# Patient Record
Sex: Male | Born: 1985 | Hispanic: Yes | Marital: Married | Smoking: Current some day smoker
Health system: Southern US, Community
[De-identification: ages and names within clinical notes are randomized; demographics above are authoritative.]

## PROBLEM LIST (undated history)

## (undated) DIAGNOSIS — F419 Anxiety disorder, unspecified: Secondary | ICD-10-CM

---
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
## 2003-03-16 ENCOUNTER — Encounter: Payer: Self-pay | Admitting: Emergency Medicine

## 2003-03-16 ENCOUNTER — Emergency Department (HOSPITAL_COMMUNITY): Admission: EM | Admit: 2003-03-16 | Discharge: 2003-03-16 | Payer: Self-pay | Admitting: Emergency Medicine

## 2009-05-17 ENCOUNTER — Emergency Department (HOSPITAL_COMMUNITY): Admission: EM | Admit: 2009-05-17 | Discharge: 2009-05-17 | Payer: Self-pay | Admitting: Emergency Medicine

## 2009-06-22 ENCOUNTER — Emergency Department (HOSPITAL_COMMUNITY): Admission: EM | Admit: 2009-06-22 | Discharge: 2009-06-22 | Payer: Self-pay | Admitting: Emergency Medicine

## 2009-10-08 ENCOUNTER — Emergency Department (HOSPITAL_BASED_OUTPATIENT_CLINIC_OR_DEPARTMENT_OTHER): Admission: EM | Admit: 2009-10-08 | Discharge: 2009-10-08 | Payer: Self-pay | Admitting: Emergency Medicine

## 2009-10-08 ENCOUNTER — Ambulatory Visit: Payer: Self-pay | Admitting: Diagnostic Radiology

## 2010-04-06 IMAGING — CT CT HEAD W/O CM
1 series · 16 of 30 positions shown, 20 images · non-contrast
Comparison: None

CLINICAL DATA: Headache and dizziness.

CT HEAD WITHOUT CONTRAST
TECHNIQUE: Contiguous axial images were obtained from the base of
the skull through the vertex without contrast.

[Series 2: brain · axial · 0.47mm/px · z∈[+134,+275]mm · 16 of 30 slices shown, 20 images]
[im 2/30  brain]
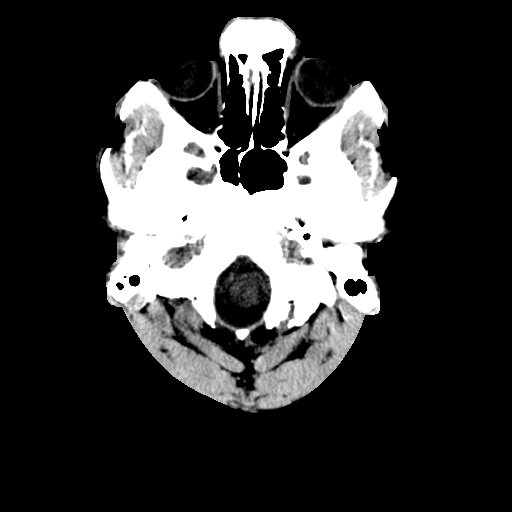
[im 2/30  bone]
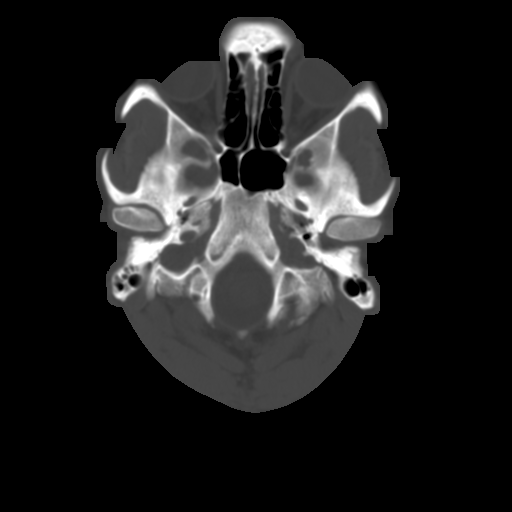
[im 4/30  brain]
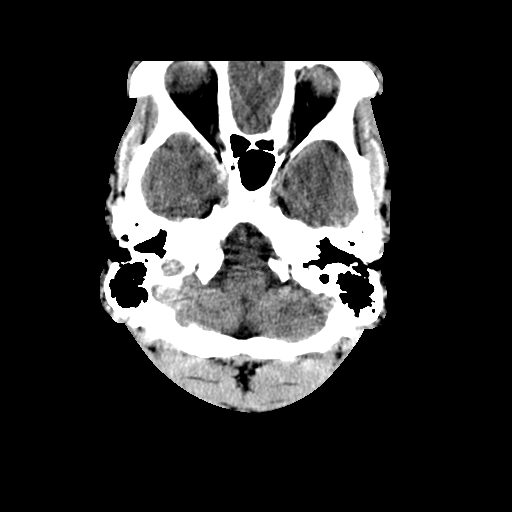
[im 6/30  brain]
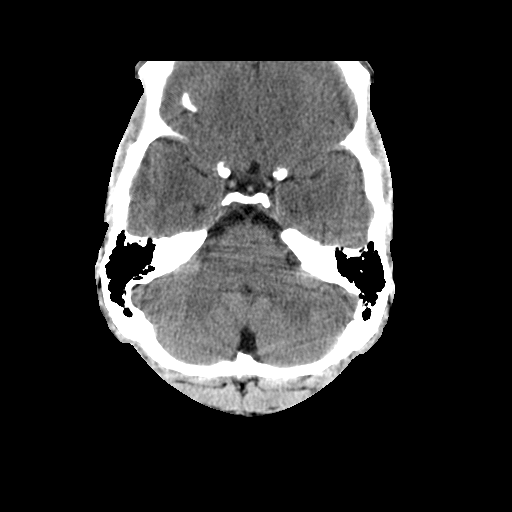
[im 8/30  brain]
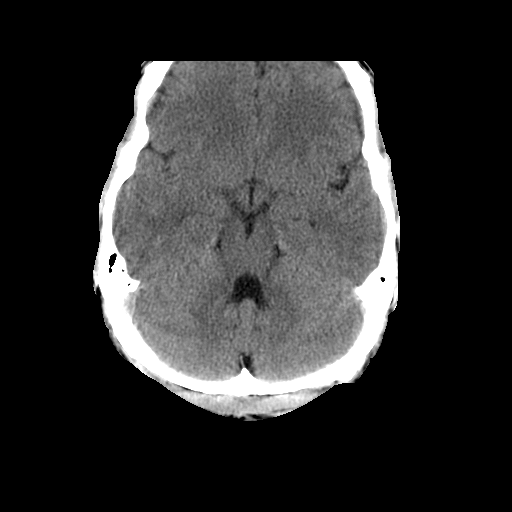
[im 9/30  brain]
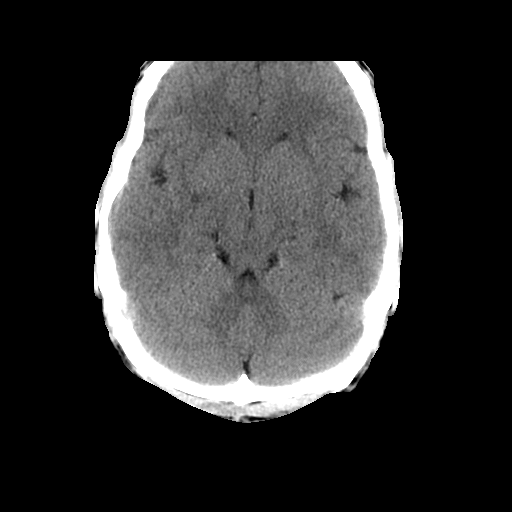
[im 9/30  bone]
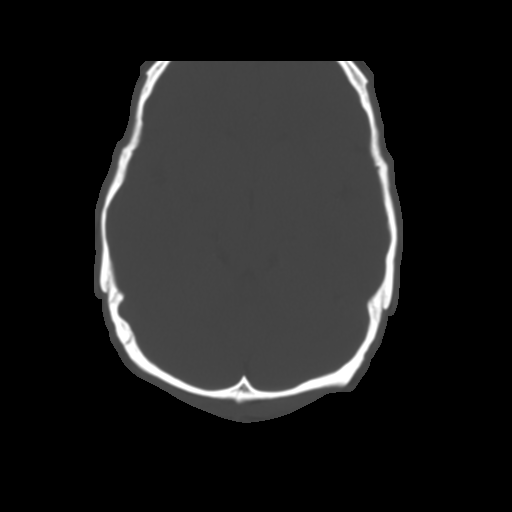
[im 11/30  brain]
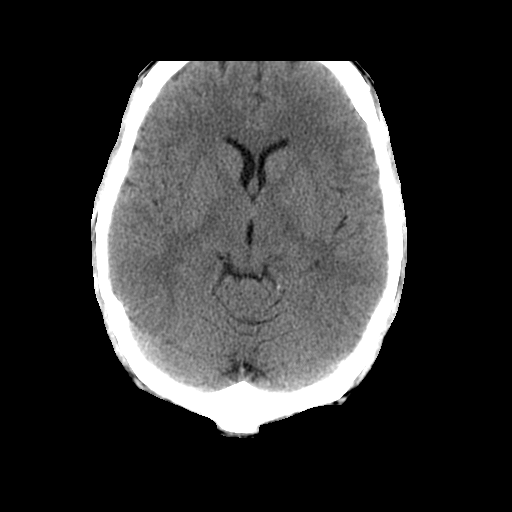
[im 13/30  brain]
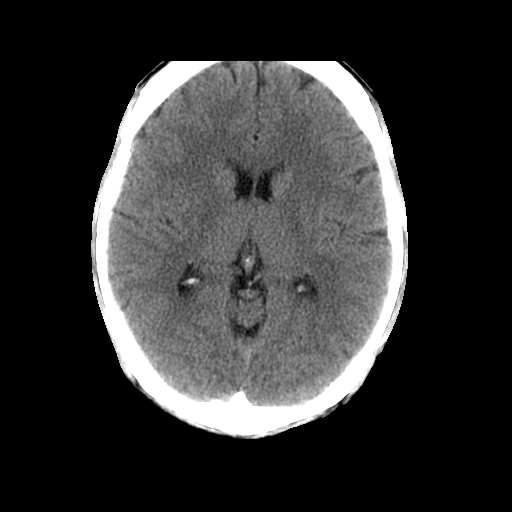
[im 15/30  brain]
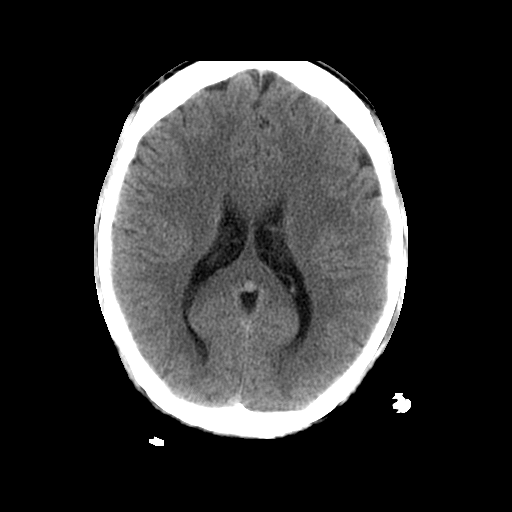
[im 16/30  brain]
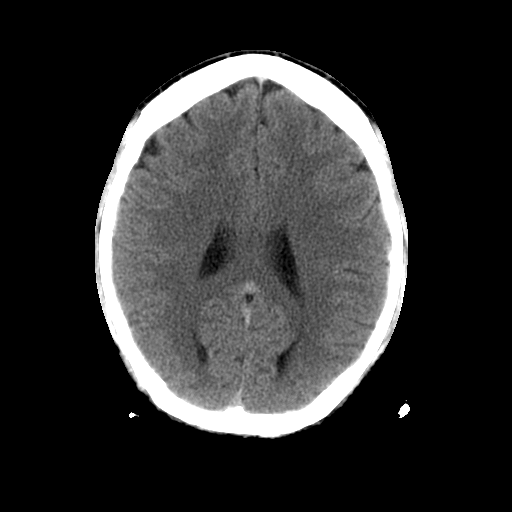
[im 16/30  bone]
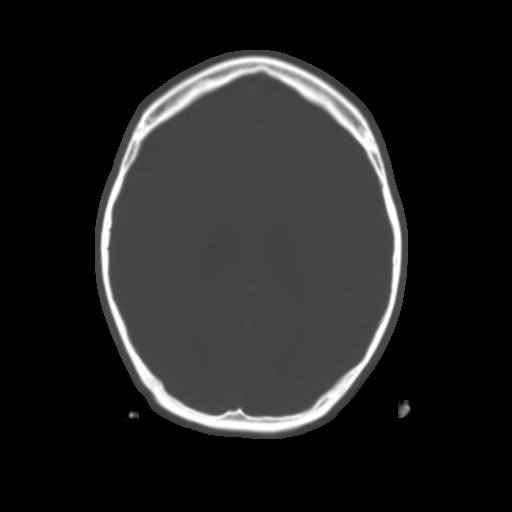
[im 18/30  brain]
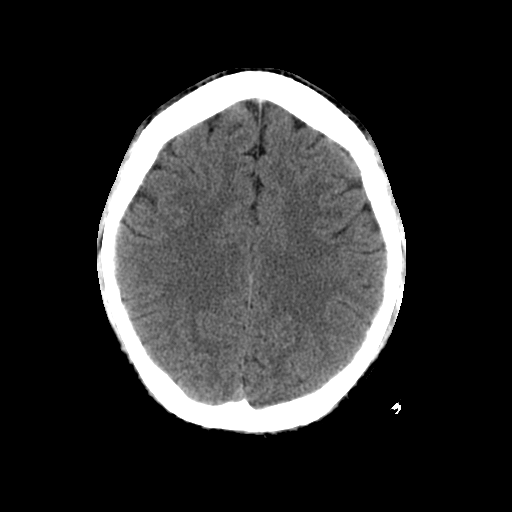
[im 20/30  brain]
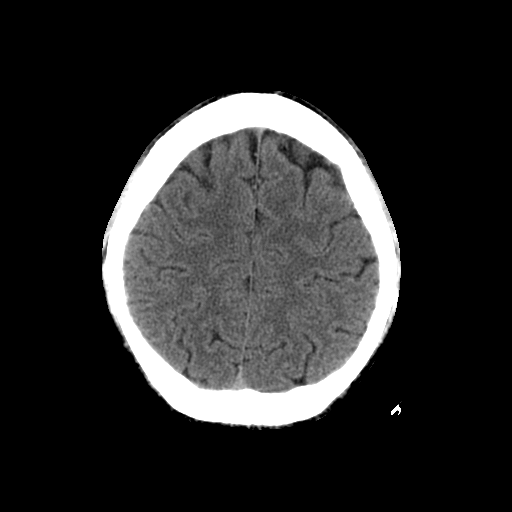
[im 22/30  brain]
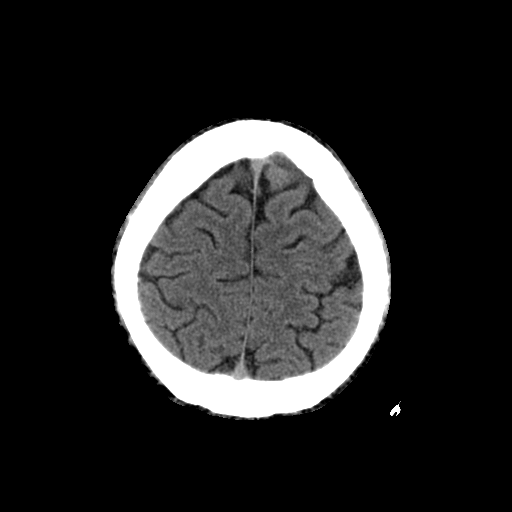
[im 23/30  brain]
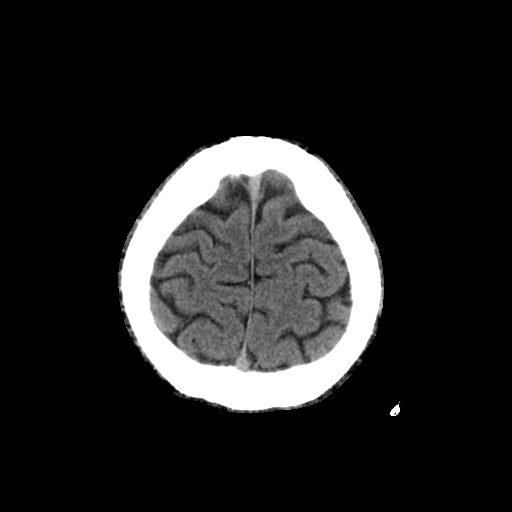
[im 23/30  bone]
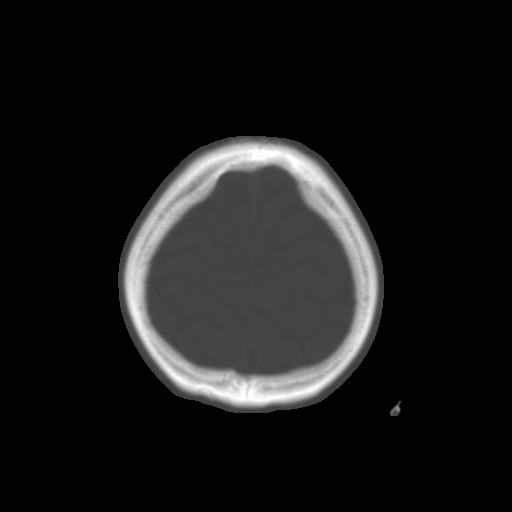
[im 25/30  brain]
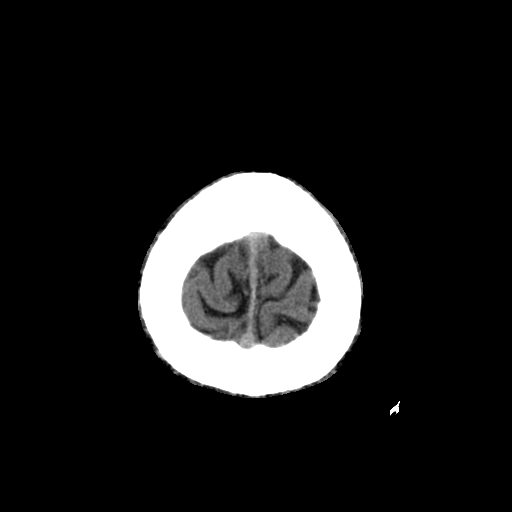
[im 27/30  brain]
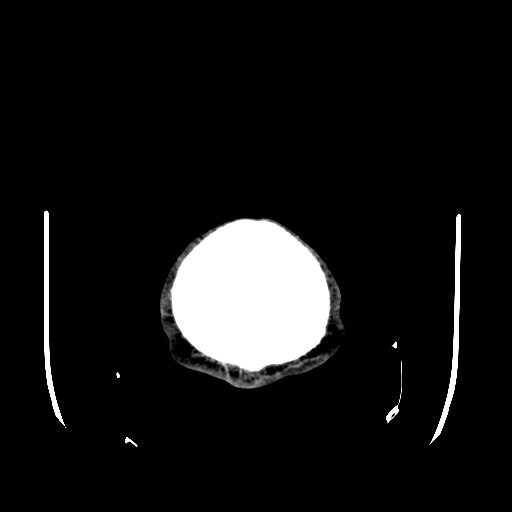
[im 29/30  brain]
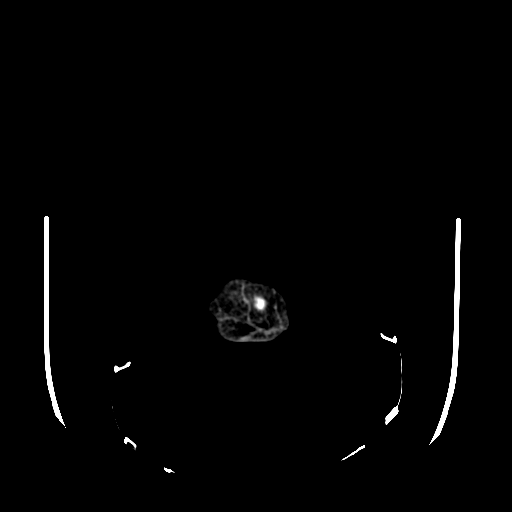

[16 of 30 positions shown; findings below may reference images not displayed]

FINDINGS: No intracranial abnormalities are identified. There is no
evidence of mass lesion or mass effect, hydrocephalus, extra-axial
fluid collection, midline shift, hemorrhage, or acute infarction.
The visualized bony calvarium is unremarkable.
IMPRESSION: Unremarkable noncontrast head CT.

## 2010-08-20 LAB — URINALYSIS, ROUTINE W REFLEX MICROSCOPIC
Bilirubin Urine: NEGATIVE
Glucose, UA: NEGATIVE mg/dL
Hgb urine dipstick: NEGATIVE
Ketones, ur: NEGATIVE mg/dL
Nitrite: NEGATIVE
Protein, ur: NEGATIVE mg/dL
Specific Gravity, Urine: 1.026 (ref 1.005–1.030)
Urobilinogen, UA: 0.2 mg/dL (ref 0.0–1.0)
pH: 6 (ref 5.0–8.0)

## 2010-08-20 LAB — CBC
HCT: 45.3 % (ref 39.0–52.0)
MCHC: 33.9 g/dL (ref 30.0–36.0)
MCV: 85.9 fL (ref 78.0–100.0)
Platelets: 277 10*3/uL (ref 150–400)
WBC: 6.6 10*3/uL (ref 4.0–10.5)

## 2010-08-20 LAB — BASIC METABOLIC PANEL
BUN: 14 mg/dL (ref 6–23)
CO2: 29 mEq/L (ref 19–32)
Chloride: 101 mEq/L (ref 96–112)
Creatinine, Ser: 0.96 mg/dL (ref 0.4–1.5)
Glucose, Bld: 97 mg/dL (ref 70–99)
Potassium: 4 mEq/L (ref 3.5–5.1)

## 2010-08-20 LAB — RAPID URINE DRUG SCREEN, HOSP PERFORMED
Amphetamines: NOT DETECTED
Barbiturates: NOT DETECTED
Benzodiazepines: NOT DETECTED
Cocaine: NOT DETECTED
Opiates: NOT DETECTED
Tetrahydrocannabinol: NOT DETECTED

## 2010-08-20 LAB — DIFFERENTIAL
Basophils Relative: 0 % (ref 0–1)
Eosinophils Absolute: 0.4 10*3/uL (ref 0.0–0.7)
Eosinophils Relative: 6 % — ABNORMAL HIGH (ref 0–5)
Lymphs Abs: 1.7 10*3/uL (ref 0.7–4.0)
Neutrophils Relative %: 59 % (ref 43–77)

## 2010-08-22 LAB — BASIC METABOLIC PANEL
CO2: 24 mEq/L (ref 19–32)
Calcium: 9.4 mg/dL (ref 8.4–10.5)
Creatinine, Ser: 1.1 mg/dL (ref 0.4–1.5)
GFR calc Af Amer: >60 mL/min (ref >60)
GFR calc non Af Amer: >60 mL/min (ref >60)
Glucose, Bld: 105 mg/dL — ABNORMAL HIGH (ref 70–99)

## 2010-09-05 LAB — DIFFERENTIAL
Basophils Absolute: 0 10*3/uL (ref 0.0–0.1)
Basophils Relative: 0 % (ref 0–1)
Lymphocytes Relative: 7 % — ABNORMAL LOW (ref 12–46)
Neutro Abs: 10.5 10*3/uL — ABNORMAL HIGH (ref 1.7–7.7)
Neutrophils Relative %: 86 % — ABNORMAL HIGH (ref 43–77)

## 2010-09-05 LAB — URINALYSIS, ROUTINE W REFLEX MICROSCOPIC
Nitrite: NEGATIVE
Specific Gravity, Urine: 1.012 (ref 1.005–1.030)
Urobilinogen, UA: 0.2 mg/dL (ref 0.0–1.0)
pH: 6.5 (ref 5.0–8.0)

## 2010-09-05 LAB — RAPID URINE DRUG SCREEN, HOSP PERFORMED
Barbiturates: POSITIVE — AB
Opiates: NOT DETECTED
Tetrahydrocannabinol: POSITIVE — AB

## 2010-09-05 LAB — POCT CARDIAC MARKERS
CKMB, poc: <1 ng/mL — ABNORMAL LOW (ref 1.0–8.0)
Troponin i, poc: <0.05 ng/mL (ref 0.00–0.09)

## 2010-09-05 LAB — CBC
MCHC: 33.8 g/dL (ref 30.0–36.0)
RBC: 5.59 MIL/uL (ref 4.22–5.81)
RDW: 14.2 % (ref 11.5–15.5)

## 2012-06-01 ENCOUNTER — Emergency Department (HOSPITAL_BASED_OUTPATIENT_CLINIC_OR_DEPARTMENT_OTHER)
Admission: EM | Admit: 2012-06-01 | Discharge: 2012-06-01 | Disposition: A | Discharge status: Home / Self Care | Payer: Self-pay | Attending: Emergency Medicine | Admitting: Emergency Medicine

## 2012-06-01 ENCOUNTER — Encounter (HOSPITAL_BASED_OUTPATIENT_CLINIC_OR_DEPARTMENT_OTHER): Payer: Self-pay | Admitting: *Deleted

## 2012-06-01 ENCOUNTER — Emergency Department (HOSPITAL_BASED_OUTPATIENT_CLINIC_OR_DEPARTMENT_OTHER): Payer: Self-pay

## 2012-06-01 DIAGNOSIS — B9789 Other viral agents as the cause of diseases classified elsewhere: Secondary | ICD-10-CM | POA: Insufficient documentation

## 2012-06-01 DIAGNOSIS — R059 Cough, unspecified: Secondary | ICD-10-CM | POA: Insufficient documentation

## 2012-06-01 DIAGNOSIS — B349 Viral infection, unspecified: Secondary | ICD-10-CM

## 2012-06-01 DIAGNOSIS — M545 Low back pain, unspecified: Secondary | ICD-10-CM | POA: Insufficient documentation

## 2012-06-01 DIAGNOSIS — R05 Cough: Secondary | ICD-10-CM | POA: Insufficient documentation

## 2012-06-01 DIAGNOSIS — J029 Acute pharyngitis, unspecified: Secondary | ICD-10-CM | POA: Insufficient documentation

## 2012-06-01 DIAGNOSIS — F17200 Nicotine dependence, unspecified, uncomplicated: Secondary | ICD-10-CM | POA: Insufficient documentation

## 2012-06-01 HISTORY — DX: Anxiety disorder, unspecified: F41.9

## 2012-06-01 LAB — URINALYSIS, ROUTINE W REFLEX MICROSCOPIC
Bilirubin Urine: NEGATIVE
Leukocytes, UA: NEGATIVE
Nitrite: NEGATIVE
Specific Gravity, Urine: 1.031 — ABNORMAL HIGH (ref 1.005–1.030)
Urobilinogen, UA: 1 mg/dL (ref 0.0–1.0)

## 2012-06-01 LAB — CBC WITH DIFFERENTIAL/PLATELET
Basophils Relative: 0 % (ref 0–1)
Eosinophils Absolute: 0.3 10*3/uL (ref 0.0–0.7)
Lymphs Abs: 1.8 10*3/uL (ref 0.7–4.0)
MCH: 27.9 pg (ref 26.0–34.0)
Neutrophils Relative %: 57 % (ref 43–77)
Platelets: 215 10*3/uL (ref 150–400)
RBC: 5.6 MIL/uL (ref 4.22–5.81)

## 2012-06-01 LAB — BASIC METABOLIC PANEL
Calcium: 9.5 mg/dL (ref 8.4–10.5)
Chloride: 103 mEq/L (ref 96–112)
Creatinine, Ser: 1.2 mg/dL (ref 0.50–1.35)
GFR calc Af Amer: >90 mL/min (ref >90)

## 2012-06-01 LAB — RAPID STREP SCREEN (MED CTR MEBANE ONLY): Streptococcus, Group A Screen (Direct): NEGATIVE

## 2012-06-01 LAB — MONONUCLEOSIS SCREEN: Mono Screen: NEGATIVE

## 2012-06-01 NOTE — ED Provider Notes (Signed)
History     CSN: [id number]  Arrival date & time 06/01/12  1301   First MD Initiated Contact with Patient 06/01/12 1341      Chief Complaint  Patient presents with  . Fatigue    (Consider location/radiation/quality/duration/timing/severity/associated sxs/prior treatment) HPI Comments: Patient presents with a one week history of fatigue, weakness, cough, sore throat, low back ache.  He has felt fevered at home but has not taken his temperature.  He denies sick contacts.  No aggravating or alleviating factors.  Cough is non-productive.  The history is provided by the patient.    Past Medical History  Diagnosis Date  . Anxiety     History reviewed. No pertinent past surgical history.  History reviewed. No pertinent family history.  History  Substance Use Topics  . Smoking status: Current Every Day Smoker  . Smokeless tobacco: Not on file  . Alcohol Use: Yes      Review of Systems  All other systems reviewed and are negative.    Allergies  Review of patient's allergies indicates no known allergies.  Home Medications  No current outpatient prescriptions on file.  BP 116/69  Pulse 81  Temp 98.9 F (37.2 C) (Oral)  Resp 16  Ht 5\' 10"  (1.778 m)  Wt 220 lb (99.791 kg)  BMI 31.57 kg/m2  SpO2 99%  Physical Exam  Nursing note and vitals reviewed. Constitutional: He is oriented to person, place, and time. He appears well-developed and well-nourished. No distress.  HENT:  Head: Normocephalic and atraumatic.  Mouth/Throat: Oropharynx is clear and moist.  Neck: Normal range of motion. Neck supple.  Cardiovascular: Normal rate and regular rhythm.   No murmur heard. Pulmonary/Chest: Effort normal and breath sounds normal. No respiratory distress. He has no wheezes.  Abdominal: Soft. Bowel sounds are normal. He exhibits no distension. There is no tenderness.  Musculoskeletal: Normal range of motion.  Lymphadenopathy:    He has no cervical adenopathy.    Neurological: He is alert and oriented to person, place, and time.  Skin: Skin is warm and dry. He is not diaphoretic.    ED Course  Procedures (including critical care time)  Labs Reviewed  URINALYSIS, ROUTINE W REFLEX MICROSCOPIC - Abnormal; Notable for the following:    Specific Gravity, Urine 1.031 (*)     All other components within normal limits  CBC WITH DIFFERENTIAL  MONONUCLEOSIS SCREEN  RAPID STREP SCREEN  BASIC METABOLIC PANEL   No results found.   No diagnosis found.    MDM  The presentation and exam lead me to believe this is viral illness.  I discussed this with the patient who feels as though there is something more going on.  I performed a workup which has ruled out strep, mono, pneumonia, and the cbc and urine are all unremarkable.  Will discharge to home, return prn.        Geoffery Lyons, MD 06/01/12 [phone]

## 2012-06-01 NOTE — ED Notes (Signed)
Pt states he has had flu-like s/s x 1 week (body aches, SHOB, dizziness, cold sweats, fatigue)

## 2012-06-01 NOTE — ED Notes (Signed)
Patient C/O weakness, fatigue and body aches for 1 week.  States that some co-workers were diagnosed with the flu.  Denies sore throat. Reports occasional cough. C/o post nasal drainage.  Denies fever but reports chills.

## 2012-09-08 ENCOUNTER — Emergency Department (HOSPITAL_BASED_OUTPATIENT_CLINIC_OR_DEPARTMENT_OTHER): Payer: Self-pay

## 2012-09-08 ENCOUNTER — Emergency Department (HOSPITAL_BASED_OUTPATIENT_CLINIC_OR_DEPARTMENT_OTHER)
Admission: EM | Admit: 2012-09-08 | Discharge: 2012-09-08 | Disposition: A | Discharge status: Home / Self Care | Payer: Self-pay | Attending: Emergency Medicine | Admitting: Emergency Medicine

## 2012-09-08 ENCOUNTER — Encounter (HOSPITAL_BASED_OUTPATIENT_CLINIC_OR_DEPARTMENT_OTHER): Payer: Self-pay | Admitting: *Deleted

## 2012-09-08 DIAGNOSIS — S93401A Sprain of unspecified ligament of right ankle, initial encounter: Secondary | ICD-10-CM

## 2012-09-08 DIAGNOSIS — F17200 Nicotine dependence, unspecified, uncomplicated: Secondary | ICD-10-CM | POA: Insufficient documentation

## 2012-09-08 DIAGNOSIS — Y9367 Activity, basketball: Secondary | ICD-10-CM | POA: Insufficient documentation

## 2012-09-08 DIAGNOSIS — X500XXA Overexertion from strenuous movement or load, initial encounter: Secondary | ICD-10-CM | POA: Insufficient documentation

## 2012-09-08 DIAGNOSIS — Y92838 Other recreation area as the place of occurrence of the external cause: Secondary | ICD-10-CM | POA: Insufficient documentation

## 2012-09-08 DIAGNOSIS — Y9239 Other specified sports and athletic area as the place of occurrence of the external cause: Secondary | ICD-10-CM | POA: Insufficient documentation

## 2012-09-08 DIAGNOSIS — Z8659 Personal history of other mental and behavioral disorders: Secondary | ICD-10-CM | POA: Insufficient documentation

## 2012-09-08 DIAGNOSIS — S93409A Sprain of unspecified ligament of unspecified ankle, initial encounter: Secondary | ICD-10-CM | POA: Insufficient documentation

## 2012-09-08 MED ORDER — OXYCODONE-ACETAMINOPHEN 5-325 MG PO TABS
1.0000 | ORAL_TABLET | Freq: Once | ORAL | Status: AC
  Administered 2012-09-08: 1 via ORAL
  Filled 2012-09-08 (×2): qty 1

## 2012-09-08 MED ORDER — OXYCODONE-ACETAMINOPHEN 5-325 MG PO TABS: 1.0000 | ORAL_TABLET | Freq: Four times a day (QID) | ORAL | Status: DC | PRN

## 2012-09-08 NOTE — ED Provider Notes (Signed)
History     CSN: [id number]  Arrival date & time 09/08/12  1230   First MD Initiated Contact with Patient 09/08/12 1325      Chief Complaint  Patient presents with  . Ankle Pain    (Consider location/radiation/quality/duration/timing/severity/associated sxs/prior treatment) Patient is a 27 y.o. male presenting with ankle pain. The history is provided by the patient.  Ankle Pain Location:  Ankle and foot  patient twisted his right ankle a few days ago playing basketball. States that he is taking ibuprofen without relief. She's had continued pain. No other injury. No difficulty breathing. No chest pain. His been ambulatory on it. He did not fall and hit his head.  Past Medical History  Diagnosis Date  . Anxiety     History reviewed. No pertinent past surgical history.  History reviewed. No pertinent family history.  History  Substance Use Topics  . Smoking status: Current Every Day Smoker  . Smokeless tobacco: Not on file  . Alcohol Use: Yes      Review of Systems  Respiratory: Negative for cough and shortness of breath.   Musculoskeletal:       Right foot pain and ankle pain  Skin: Negative for wound.  Neurological: Negative for weakness and numbness.    Allergies  Review of patient's allergies indicates no known allergies.  Home Medications   Current Outpatient Rx  Name  Route  Sig  Dispense  Refill  . oxyCODONE-acetaminophen (PERCOCET/ROXICET) 5-325 MG per tablet   Oral   Take 1-2 tablets by mouth every 6 (six) hours as needed for pain.   10 tablet   0     BP 122/51  Pulse 68  Temp(Src) 98.5 F (36.9 C) (Oral)  Resp 16  Ht 5\' 10"  (1.778 m)  Wt 231 lb (104.781 kg)  BMI 33.15 kg/m2  SpO2 99%  Physical Exam  Constitutional: He appears well-developed and well-nourished.  HENT:  Head: Normocephalic and atraumatic.  Musculoskeletal: He exhibits tenderness.  Tenderness to distal right lateral malleolus. Tenderness laterally or foot also. Some  tenderness over base of fifth metatarsal. Neurovascular intact distally. Range of motion intact at ankle.  Neurological: He is alert.  Skin: Skin is warm. No rash noted.    ED Course  Procedures (including critical care time)  Labs Reviewed - No data to display Dg Ankle Complete Right  09/08/2012  *RADIOLOGY REPORT*  Clinical Data: Ankle pain, swelling, basketball injury  RIGHT ANKLE - COMPLETE 3+ VIEW  Comparison: None  Findings: Three views of the right ankle submitted.  No acute fracture or subluxation.  Ankle mortise is preserved.  Mild lateral soft tissue swelling.  IMPRESSION: No acute fracture or subluxation.  Mild soft tissue swelling adjacent to lateral malleolus.   Original Report Authenticated By: Natasha Mead, M.D.    Dg Foot Complete Right  09/08/2012  *RADIOLOGY REPORT*  Clinical Data: Basketball injury with pain when dorsiflexing.  RIGHT FOOT COMPLETE - 3+ VIEW  Comparison: Ankle films same date  Findings: No acute fracture or dislocation.  IMPRESSION: No acute osseous abnormality.   Original Report Authenticated By: Jeronimo Greaves, M.D.      1. Ankle sprain, right, initial encounter       MDM  Patient with foot and ankle pain after injury. X-rays are negative for fracture. Patient will be given an ASO and pain medication will followup with sports medicine as needed        Juliet Rude. Rubin Payor, MD 09/08/12 [phone]

## 2012-09-08 NOTE — ED Notes (Signed)
Playing basketball last week heard right ankle pop and has been having pain since

## 2012-10-21 ENCOUNTER — Emergency Department (HOSPITAL_BASED_OUTPATIENT_CLINIC_OR_DEPARTMENT_OTHER)
Admission: EM | Admit: 2012-10-21 | Discharge: 2012-10-21 | Disposition: A | Discharge status: Home / Self Care | Payer: Self-pay | Attending: Emergency Medicine | Admitting: Emergency Medicine

## 2012-10-21 ENCOUNTER — Encounter (HOSPITAL_BASED_OUTPATIENT_CLINIC_OR_DEPARTMENT_OTHER): Payer: Self-pay | Admitting: Family Medicine

## 2012-10-21 ENCOUNTER — Emergency Department (HOSPITAL_BASED_OUTPATIENT_CLINIC_OR_DEPARTMENT_OTHER): Payer: Self-pay

## 2012-10-21 ENCOUNTER — Other Ambulatory Visit: Payer: Self-pay

## 2012-10-21 DIAGNOSIS — R059 Cough, unspecified: Secondary | ICD-10-CM | POA: Insufficient documentation

## 2012-10-21 DIAGNOSIS — IMO0001 Reserved for inherently not codable concepts without codable children: Secondary | ICD-10-CM | POA: Insufficient documentation

## 2012-10-21 DIAGNOSIS — R05 Cough: Secondary | ICD-10-CM | POA: Insufficient documentation

## 2012-10-21 DIAGNOSIS — R5383 Other fatigue: Secondary | ICD-10-CM | POA: Insufficient documentation

## 2012-10-21 DIAGNOSIS — J329 Chronic sinusitis, unspecified: Secondary | ICD-10-CM | POA: Insufficient documentation

## 2012-10-21 DIAGNOSIS — R52 Pain, unspecified: Secondary | ICD-10-CM | POA: Insufficient documentation

## 2012-10-21 DIAGNOSIS — R51 Headache: Secondary | ICD-10-CM | POA: Insufficient documentation

## 2012-10-21 DIAGNOSIS — R0982 Postnasal drip: Secondary | ICD-10-CM | POA: Insufficient documentation

## 2012-10-21 DIAGNOSIS — F17200 Nicotine dependence, unspecified, uncomplicated: Secondary | ICD-10-CM | POA: Insufficient documentation

## 2012-10-21 DIAGNOSIS — R6883 Chills (without fever): Secondary | ICD-10-CM | POA: Insufficient documentation

## 2012-10-21 DIAGNOSIS — J029 Acute pharyngitis, unspecified: Secondary | ICD-10-CM | POA: Insufficient documentation

## 2012-10-21 DIAGNOSIS — G478 Other sleep disorders: Secondary | ICD-10-CM | POA: Insufficient documentation

## 2012-10-21 DIAGNOSIS — R5381 Other malaise: Secondary | ICD-10-CM | POA: Insufficient documentation

## 2012-10-21 DIAGNOSIS — Z8659 Personal history of other mental and behavioral disorders: Secondary | ICD-10-CM | POA: Insufficient documentation

## 2012-10-21 MED ORDER — GUAIFENESIN 100 MG/5ML PO LIQD: 100.0000 mg | ORAL | Status: DC | PRN

## 2012-10-21 MED ORDER — AMOXICILLIN 500 MG PO CAPS: 500.0000 mg | ORAL_CAPSULE | Freq: Three times a day (TID) | ORAL | Status: DC

## 2012-10-21 NOTE — ED Notes (Addendum)
Pt c/o cough, congestion, post nasal drip and generalized body aches x several days. Pt taking theraflu at home, no relief. Pt child dx with pneumonia recently.

## 2012-10-21 NOTE — ED Provider Notes (Signed)
History     CSN: [id number]  Arrival date & time 10/21/12  0921   First MD Initiated Contact with Patient 10/21/12 [phone]      Chief Complaint  Patient presents with  . Nasal Congestion  . Cough    (Consider location/radiation/quality/duration/timing/severity/associated sxs/prior treatment) HPI Comments: Three-day history of cough, congestion, nasal drip, generalized body aches. Sick contacts at home. Denies fevers, nausea or vomiting. Denies abdominal pain. Complains of head pressure, chest pressure, diffuse body aches and chills but has not checked temperature. Good by mouth intake and urine output. No rash.  No recent travel or antibiotics use.  The history is provided by the patient.    Past Medical History  Diagnosis Date  . Anxiety     History reviewed. No pertinent past surgical history.  No family history on file.  History  Substance Use Topics  . Smoking status: Current Every Day Smoker  . Smokeless tobacco: Not on file  . Alcohol Use: Yes      Review of Systems  Constitutional: Positive for activity change, appetite change and fatigue. Negative for fever.  HENT: Positive for congestion, sore throat, rhinorrhea and sinus pressure. Negative for neck pain and neck stiffness.   Respiratory: Positive for cough.   Cardiovascular: Negative for chest pain.  Gastrointestinal: Negative for nausea, vomiting and abdominal pain.  Genitourinary: Negative for dysuria.  Musculoskeletal: Positive for myalgias and arthralgias.  Neurological: Positive for headaches.  A complete 10 system review of systems was obtained and all systems are negative except as noted in the HPI and PMH.    Allergies  Review of patient's allergies indicates no known allergies.  Home Medications   Current Outpatient Rx  Name  Route  Sig  Dispense  Refill  . oxyCODONE-acetaminophen (PERCOCET/ROXICET) 5-325 MG per tablet   Oral   Take 1-2 tablets by mouth every 6 (six) hours as needed for  pain.   10 tablet   0     BP 118/77  Pulse 64  Temp(Src) 98.2 F (36.8 C) (Oral)  Resp 18  SpO2 100%  Physical Exam  Constitutional: He is oriented to person, place, and time. He appears well-developed and well-nourished. No distress.  HENT:  Head: Normocephalic and atraumatic.  Mouth/Throat: Oropharynx is clear and moist. No oropharyngeal exudate.  Frontal and maxillary sinus tenderness  Eyes: Conjunctivae and EOM are normal. Pupils are equal, round, and reactive to light.  Neck: Normal range of motion. Neck supple.  No meningismus  Cardiovascular: Normal rate, regular rhythm and normal heart sounds.   No murmur heard. Pulmonary/Chest: Effort normal and breath sounds normal. No respiratory distress.  Abdominal: Soft. There is no tenderness. There is no rebound and no guarding.  Musculoskeletal: Normal range of motion. He exhibits no edema and no tenderness.  Neurological: He is alert and oriented to person, place, and time. No cranial nerve deficit. He exhibits normal muscle tone. Coordination normal.  Skin: Skin is warm.    ED Course  Procedures (including critical care time)  Labs Reviewed - No data to display Dg Chest 2 View  10/21/2012   *RADIOLOGY REPORT*  Clinical Data: Nasal congestion and cough  CHEST - 2 VIEW  Comparison: June 01, 2012  Findings:  Lungs clear.  Heart size and pulmonary vascularity are normal.  No adenopathy.  No bone lesions.  IMPRESSION: No abnormality noted.   Original Report Authenticated By: Bretta Bang, M.D.     No diagnosis found.    MDM  3 history  of cough, congestion, rhinorrhea, body aches and chills. Sick contacts at home. Nontoxic, afebrile, vitals stable. No meningismus.  Chest x-ray negative. We'll treat for sinusitis. Return precautions discussed.     Date: 10/21/2012  Rate: 61  Rhythm: normal sinus rhythm  QRS Axis: normal  Intervals: normal  ST/T Wave abnormalities: normal  Conduction Disutrbances:none   Narrative Interpretation:   Old EKG Reviewed: none available    Glynn Octave, MD 10/21/12 1515

## 2013-01-19 ENCOUNTER — Encounter (HOSPITAL_BASED_OUTPATIENT_CLINIC_OR_DEPARTMENT_OTHER): Payer: Self-pay | Admitting: *Deleted

## 2013-01-19 ENCOUNTER — Emergency Department (HOSPITAL_BASED_OUTPATIENT_CLINIC_OR_DEPARTMENT_OTHER): Payer: Self-pay

## 2013-01-19 ENCOUNTER — Emergency Department (HOSPITAL_BASED_OUTPATIENT_CLINIC_OR_DEPARTMENT_OTHER)
Admission: EM | Admit: 2013-01-19 | Discharge: 2013-01-19 | Disposition: A | Discharge status: Home / Self Care | Payer: Self-pay | Attending: Emergency Medicine | Admitting: Emergency Medicine

## 2013-01-19 DIAGNOSIS — M545 Low back pain, unspecified: Secondary | ICD-10-CM | POA: Insufficient documentation

## 2013-01-19 DIAGNOSIS — Z72820 Sleep deprivation: Secondary | ICD-10-CM | POA: Insufficient documentation

## 2013-01-19 DIAGNOSIS — F411 Generalized anxiety disorder: Secondary | ICD-10-CM | POA: Insufficient documentation

## 2013-01-19 DIAGNOSIS — R0602 Shortness of breath: Secondary | ICD-10-CM | POA: Insufficient documentation

## 2013-01-19 DIAGNOSIS — R450 Nervousness: Secondary | ICD-10-CM | POA: Insufficient documentation

## 2013-01-19 DIAGNOSIS — F17200 Nicotine dependence, unspecified, uncomplicated: Secondary | ICD-10-CM | POA: Insufficient documentation

## 2013-01-19 DIAGNOSIS — M549 Dorsalgia, unspecified: Secondary | ICD-10-CM

## 2013-01-19 MED ORDER — NAPROXEN 500 MG PO TABS: 500.0000 mg | ORAL_TABLET | Freq: Two times a day (BID) | ORAL | Status: DC

## 2013-01-19 MED ORDER — ZOLPIDEM TARTRATE 5 MG PO TABS: 5.0000 mg | ORAL_TABLET | Freq: Every evening | ORAL | Status: DC | PRN

## 2013-01-19 NOTE — ED Provider Notes (Signed)
CSN: [id number]     Arrival date & time 01/19/13  4098 History     None    No chief complaint on file.  (Consider location/radiation/quality/duration/timing/severity/associated sxs/prior Treatment) HPI Comments: Patient is a 27 year old male with a past medical history of anxiety who presents with sudden onset of dizziness, lightheadedness, SOB, and anxiety that occurred prior to arrival when he was at work. Patient reports not sleeping much last night due to his back pain and having these intermittent symptoms since being at work. Patient denies any other associated symptoms. Patient reports these symptoms feel like his anxiety. No aggravating/alleviating factors.    Past Medical History  Diagnosis Date  . Anxiety    No past surgical history on file. No family history on file. History  Substance Use Topics  . Smoking status: Current Every Day Smoker  . Smokeless tobacco: Not on file  . Alcohol Use: Yes    Review of Systems  Neurological: Positive for dizziness.  Psychiatric/Behavioral: The patient is nervous/anxious.   All other systems reviewed and are negative.    Allergies  Review of patient's allergies indicates no known allergies.  Home Medications   Current Outpatient Rx  Name  Route  Sig  Dispense  Refill  . amoxicillin (AMOXIL) 500 MG capsule   Oral   Take 1 capsule (500 mg total) by mouth 3 (three) times daily.   21 capsule   0   . guaiFENesin (ROBITUSSIN) 100 MG/5ML liquid   Oral   Take 5-10 mLs (100-200 mg total) by mouth every 4 (four) hours as needed for cough.   60 mL   0   . oxyCODONE-acetaminophen (PERCOCET/ROXICET) 5-325 MG per tablet   Oral   Take 1-2 tablets by mouth every 6 (six) hours as needed for pain.   10 tablet   0    BP 105/54  Pulse 62  Temp(Src) 98.3 F (36.8 C) (Oral)  Resp 16  Ht 5\' 10"  (1.778 m)  Wt 225 lb (102.059 kg)  BMI 32.28 kg/m2  SpO2 100% Physical Exam  Nursing note and vitals reviewed. Constitutional: He is  oriented to person, place, and time. He appears well-developed and well-nourished. No distress.  HENT:  Head: Normocephalic and atraumatic.  Eyes: Conjunctivae and EOM are normal. Pupils are equal, round, and reactive to light. No scleral icterus.  NO nystagmus noted on EOM.   Neck: Normal range of motion.  Cardiovascular: Normal rate and regular rhythm.  Exam reveals no gallop and no friction rub.   No murmur heard. Pulmonary/Chest: Effort normal and breath sounds normal. He has no wheezes. He has no rales. He exhibits no tenderness.  Abdominal: Soft. He exhibits no distension. There is no tenderness. There is no rebound.  Musculoskeletal: Normal range of motion.  Neurological: He is alert and oriented to person, place, and time. Coordination normal.  Speech is goal-oriented. Moves limbs without ataxia.   Skin: Skin is warm and dry.  Psychiatric: He has a normal mood and affect. His behavior is normal.    ED Course   Procedures (including critical care time)  Labs Reviewed - No data to display No results found. 1. Back pain   2. Sleep deprivation     MDM  10:39 AM Patient likely having symptoms due to lack of sleep due to back pain. Vitals stable and patient afebrile. NO neuro deficit noted. Patient will be discharged with Naprosyn for back pain and ambien for sleep. Patient instructed to rest for the  remainder of the day.    Emilia Beck, PA-C 01/19/13 1052

## 2013-01-19 NOTE — ED Provider Notes (Signed)
Medical screening examination/treatment/procedure(s) were performed by non-physician practitioner and as supervising physician I was immediately available for consultation/collaboration.   Rosann Gorum M Haydan Wedig, MD 01/19/13 1454 

## 2013-01-19 NOTE — ED Notes (Signed)
Patient states he was at work today and had a sudden onset of dizziness, followed by seeing "blue ballons", nausea and shortness of breath.  States he did not sleep well last night or this past week due to chronic pain in his lower back.  States he has been under a lot of stress lately and felt overwhelmed while at work.

## 2013-12-05 ENCOUNTER — Emergency Department (HOSPITAL_BASED_OUTPATIENT_CLINIC_OR_DEPARTMENT_OTHER)
Admission: EM | Admit: 2013-12-05 | Discharge: 2013-12-05 | Disposition: A | Discharge status: Home / Self Care | Payer: Managed Care, Other (non HMO) | Attending: Emergency Medicine | Admitting: Emergency Medicine

## 2013-12-05 ENCOUNTER — Encounter (HOSPITAL_BASED_OUTPATIENT_CLINIC_OR_DEPARTMENT_OTHER): Payer: Self-pay | Admitting: Emergency Medicine

## 2013-12-05 ENCOUNTER — Emergency Department (HOSPITAL_BASED_OUTPATIENT_CLINIC_OR_DEPARTMENT_OTHER): Payer: Managed Care, Other (non HMO)

## 2013-12-05 DIAGNOSIS — R509 Fever, unspecified: Secondary | ICD-10-CM | POA: Insufficient documentation

## 2013-12-05 DIAGNOSIS — K5289 Other specified noninfective gastroenteritis and colitis: Secondary | ICD-10-CM | POA: Insufficient documentation

## 2013-12-05 DIAGNOSIS — Z87891 Personal history of nicotine dependence: Secondary | ICD-10-CM | POA: Insufficient documentation

## 2013-12-05 DIAGNOSIS — Z792 Long term (current) use of antibiotics: Secondary | ICD-10-CM | POA: Insufficient documentation

## 2013-12-05 DIAGNOSIS — R6883 Chills (without fever): Secondary | ICD-10-CM | POA: Insufficient documentation

## 2013-12-05 DIAGNOSIS — Z79899 Other long term (current) drug therapy: Secondary | ICD-10-CM | POA: Insufficient documentation

## 2013-12-05 DIAGNOSIS — K529 Noninfective gastroenteritis and colitis, unspecified: Secondary | ICD-10-CM

## 2013-12-05 DIAGNOSIS — R5383 Other fatigue: Secondary | ICD-10-CM

## 2013-12-05 DIAGNOSIS — R5381 Other malaise: Secondary | ICD-10-CM | POA: Insufficient documentation

## 2013-12-05 DIAGNOSIS — Z791 Long term (current) use of non-steroidal anti-inflammatories (NSAID): Secondary | ICD-10-CM | POA: Insufficient documentation

## 2013-12-05 DIAGNOSIS — R110 Nausea: Secondary | ICD-10-CM | POA: Insufficient documentation

## 2013-12-05 DIAGNOSIS — Z8659 Personal history of other mental and behavioral disorders: Secondary | ICD-10-CM | POA: Insufficient documentation

## 2013-12-05 LAB — CBC WITH DIFFERENTIAL/PLATELET
Basophils Absolute: 0 10*3/uL (ref 0.0–0.1)
Basophils Relative: 0 % (ref 0–1)
EOS PCT: 3 % (ref 0–5)
Eosinophils Absolute: 0.2 10*3/uL (ref 0.0–0.7)
HCT: 42.9 % (ref 39.0–52.0)
Hemoglobin: 14.1 g/dL (ref 13.0–17.0)
LYMPHS PCT: 33 % (ref 12–46)
Lymphs Abs: 2.5 10*3/uL (ref 0.7–4.0)
MCH: 27.5 pg (ref 26.0–34.0)
MCHC: 32.9 g/dL (ref 30.0–36.0)
MCV: 83.6 fL (ref 78.0–100.0)
Monocytes Absolute: 1.2 10*3/uL — ABNORMAL HIGH (ref 0.1–1.0)
Monocytes Relative: 15 % — ABNORMAL HIGH (ref 3–12)
NEUTROS PCT: 49 % (ref 43–77)
Neutro Abs: 3.8 10*3/uL (ref 1.7–7.7)
PLATELETS: 192 10*3/uL (ref 150–400)
RBC: 5.13 MIL/uL (ref 4.22–5.81)
RDW: 15.2 % (ref 11.5–15.5)
WBC: 7.7 10*3/uL (ref 4.0–10.5)

## 2013-12-05 LAB — COMPREHENSIVE METABOLIC PANEL
ALT: 28 U/L (ref 0–53)
AST: 23 U/L (ref 0–37)
Albumin: 3.6 g/dL (ref 3.5–5.2)
Alkaline Phosphatase: 64 U/L (ref 39–117)
Anion gap: 12 (ref 5–15)
BUN: 14 mg/dL (ref 6–23)
CALCIUM: 9 mg/dL (ref 8.4–10.5)
CO2: 25 meq/L (ref 19–32)
Chloride: 105 mEq/L (ref 96–112)
Creatinine, Ser: 1.6 mg/dL — ABNORMAL HIGH (ref 0.50–1.35)
GFR calc Af Amer: 65 mL/min — ABNORMAL LOW (ref >90)
GFR calc non Af Amer: 56 mL/min — ABNORMAL LOW (ref >90)
Glucose, Bld: 108 mg/dL — ABNORMAL HIGH (ref 70–99)
POTASSIUM: 4 meq/L (ref 3.7–5.3)
SODIUM: 142 meq/L (ref 137–147)
Total Bilirubin: 0.3 mg/dL (ref 0.3–1.2)
Total Protein: 7.4 g/dL (ref 6.0–8.3)

## 2013-12-05 MED ORDER — SODIUM CHLORIDE 0.9 % IV BOLUS (SEPSIS)
1000.0000 mL | Freq: Once | INTRAVENOUS | Status: AC
  Administered 2013-12-05: 1000 mL via INTRAVENOUS

## 2013-12-05 MED ORDER — IOHEXOL 300 MG/ML  SOLN
50.0000 mL | Freq: Once | INTRAMUSCULAR | Status: AC | PRN
  Administered 2013-12-05: 50 mL via ORAL

## 2013-12-05 MED ORDER — DICYCLOMINE HCL 10 MG/ML IM SOLN
20.0000 mg | Freq: Once | INTRAMUSCULAR | Status: AC
  Administered 2013-12-05: 20 mg via INTRAMUSCULAR
  Filled 2013-12-05: qty 2

## 2013-12-05 MED ORDER — MORPHINE SULFATE 2 MG/ML IJ SOLN
2.0000 mg | Freq: Once | INTRAMUSCULAR | Status: DC
  Filled 2013-12-05: qty 1

## 2013-12-05 MED ORDER — CIPROFLOXACIN HCL 500 MG PO TABS: 500.0000 mg | ORAL_TABLET | Freq: Two times a day (BID) | ORAL | Status: DC

## 2013-12-05 MED ORDER — CIPROFLOXACIN IN D5W 400 MG/200ML IV SOLN
400.0000 mg | Freq: Once | INTRAVENOUS | Status: AC
  Administered 2013-12-05: 400 mg via INTRAVENOUS
  Filled 2013-12-05: qty 200

## 2013-12-05 MED ORDER — METRONIDAZOLE IN NACL 5-0.79 MG/ML-% IV SOLN
500.0000 mg | Freq: Once | INTRAVENOUS | Status: AC
  Administered 2013-12-05: 500 mg via INTRAVENOUS
  Filled 2013-12-05 (×2): qty 100

## 2013-12-05 MED ORDER — IOHEXOL 300 MG/ML  SOLN
80.0000 mL | Freq: Once | INTRAMUSCULAR | Status: AC | PRN
  Administered 2013-12-05: 80 mL via INTRAVENOUS

## 2013-12-05 MED ORDER — METRONIDAZOLE 500 MG PO TABS: 500.0000 mg | ORAL_TABLET | Freq: Two times a day (BID) | ORAL | Status: DC

## 2013-12-05 MED ORDER — HYDROCODONE-ACETAMINOPHEN 5-325 MG PO TABS: 1.0000 | ORAL_TABLET | ORAL | Status: DC | PRN

## 2013-12-05 NOTE — ED Notes (Signed)
Patient transported to CT 

## 2013-12-05 NOTE — ED Provider Notes (Signed)
CSN: [id number]     Arrival date & time 12/05/13  0145 History   First MD Initiated Contact with Patient 12/05/13 0204     Chief Complaint  Patient presents with  . Diarrhea     (Consider location/radiation/quality/duration/timing/severity/associated sxs/prior Treatment) HPI Patient presents with 3 days of diarrhea since returning from [place]. He also has had increasing abdominal pain especially in the lower quadrants. He states the pain somewhat decreased by having a bowel movement. He's had subjective fevers and chills. He states he's had numerous episodes of watery stools. No known sick contacts. He's had mild nausea but no vomiting. No blood or mucus in the stool. Past Medical History  Diagnosis Date  . Anxiety    History reviewed. No pertinent past surgical history. History reviewed. No pertinent family history. History  Substance Use Topics  . Smoking status: Former Smoker    Quit date: 01/16/2013  . Smokeless tobacco: Not on file  . Alcohol Use: No    Review of Systems  Constitutional: Positive for fever, chills and fatigue.  Respiratory: Negative for shortness of breath.   Cardiovascular: Negative for chest pain.  Gastrointestinal: Positive for nausea, abdominal pain and diarrhea. Negative for vomiting, constipation and blood in stool.  Genitourinary: Negative for dysuria, frequency and flank pain.  Musculoskeletal: Negative for back pain, neck pain and neck stiffness.  Skin: Negative for rash and wound.  Neurological: Negative for dizziness, weakness, light-headedness, numbness and headaches.  All other systems reviewed and are negative.     Allergies  Review of patient's allergies indicates no known allergies.  Home Medications   Prior to Admission medications   Medication Sig Start Date End Date Taking? Authorizing Provider  loperamide (IMODIUM) 2 MG capsule Take by mouth as needed for diarrhea or loose stools.   Yes Historical Provider, MD  amoxicillin  (AMOXIL) 500 MG capsule Take 1 capsule (500 mg total) by mouth 3 (three) times daily. 10/21/12   Glynn OctaveStephen Rancour, MD  guaiFENesin (ROBITUSSIN) 100 MG/5ML liquid Take 5-10 mLs (100-200 mg total) by mouth every 4 (four) hours as needed for cough. 10/21/12   Glynn OctaveStephen Rancour, MD  ibuprofen (ADVIL,MOTRIN) 800 MG tablet Take 800 mg by mouth every 8 (eight) hours as needed for pain.    Historical Provider, MD  naproxen (NAPROSYN) 500 MG tablet Take 1 tablet (500 mg total) by mouth 2 (two) times daily with a meal. 01/19/13   Emilia BeckKaitlyn Szekalski, PA-C  oxyCODONE-acetaminophen (PERCOCET/ROXICET) 5-325 MG per tablet Take 1-2 tablets by mouth every 6 (six) hours as needed for pain. 09/08/12   Juliet RudeNathan R. Pickering, MD  zolpidem (AMBIEN) 5 MG tablet Take 1 tablet (5 mg total) by mouth at bedtime as needed for sleep. 01/19/13   Kaitlyn Szekalski, PA-C   BP 109/63  Pulse 76  Temp(Src) 98.1 F (36.7 C) (Oral)  Resp 18  Ht 5\' 10"  (1.778 m)  Wt 224 lb (101.606 kg)  BMI 32.14 kg/m2  SpO2 99% Physical Exam  Nursing note and vitals reviewed. Constitutional: He is oriented to person, place, and time. He appears well-developed and well-nourished. No distress.  HENT:  Head: Normocephalic and atraumatic.  Mouth/Throat: Oropharynx is clear and moist.  Eyes: EOM are normal. Pupils are equal, round, and reactive to light.  Neck: Normal range of motion. Neck supple.  Cardiovascular: Normal rate and regular rhythm.   Pulmonary/Chest: Effort normal and breath sounds normal. No respiratory distress. He has no wheezes. He has no rales.  Abdominal: Soft. Bowel sounds are  normal. He exhibits no distension and no mass. There is tenderness (patient with bilateral lower quadrant tenderness). There is guarding. There is no rebound.  Musculoskeletal: Normal range of motion. He exhibits no edema and no tenderness.  Neurological: He is alert and oriented to person, place, and time.  Moves all extremities without deficit. Sensation is  grossly intact.  Skin: Skin is warm and dry. No rash noted. No erythema.  Psychiatric: He has a normal mood and affect. His behavior is normal.    ED Course  Procedures (including critical care time) Labs Review Labs Reviewed  CBC WITH DIFFERENTIAL  COMPREHENSIVE METABOLIC PANEL    Imaging Review No results found.   EKG Interpretation None      MDM   Final diagnoses:  None    Likely food-borne enteritis though with lower abdominal guarding we'll get CT scan to rule out colitis versus abscess versus appendicitis.  Patient treated in the emergency department for colitis. Discharge with antibiotics. He is currently stable with normal vital signs. His pain is controlled. Return precautions have been given.  Loren Raceravid Allyson Tineo, MD 12/05/13 [phone]

## 2013-12-05 NOTE — Discharge Instructions (Signed)

## 2013-12-05 NOTE — ED Notes (Signed)
[place] on [place]. Wed evening he developed severe diarrhea, with cramping,

## 2013-12-05 NOTE — ED Notes (Signed)
Patient transported to X-ray 

## 2014-09-19 IMAGING — CT CT ABD-PELV W/ CM
2 of 4 series · 17 of 46 positions shown, 19 images · IV contrast (omnipaque)
Comparison: None.

CLINICAL DATA: Lower abdominal pain.

EXAM:
CT ABDOMEN AND PELVIS WITH CONTRAST
TECHNIQUE: Multidetector CT imaging of the abdomen and pelvis was performed
using the standard protocol following bolus administration of
intravenous contrast.
CONTRAST:  50mL OMNIPAQUE IOHEXOL 300 MG/ML SOLN, 80mL OMNIPAQUE
IOHEXOL 300 MG/ML SOLN

[Series 2: abd/pelvis 5.0 b31f · axial · 0.74mm/px · z∈[-642,-182]mm · 14 of 102 slices shown, 16 images]
[im 5/102  soft-tissue]
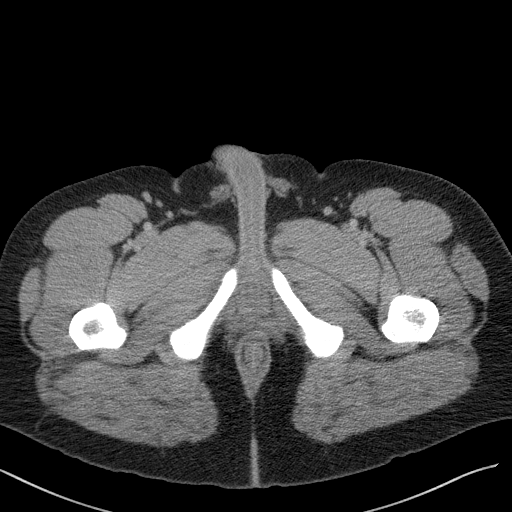
[im 5/102  bone]
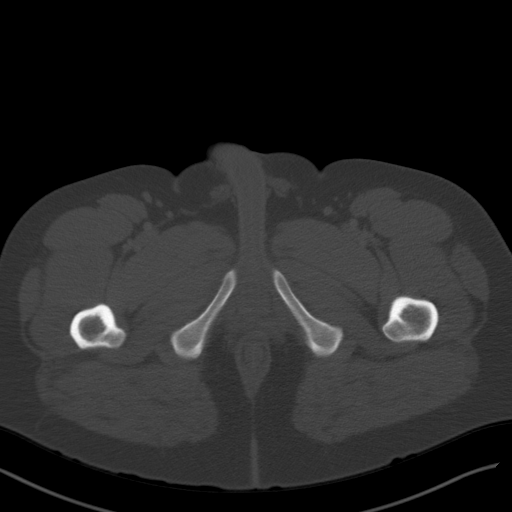
[im 13/102  soft-tissue]
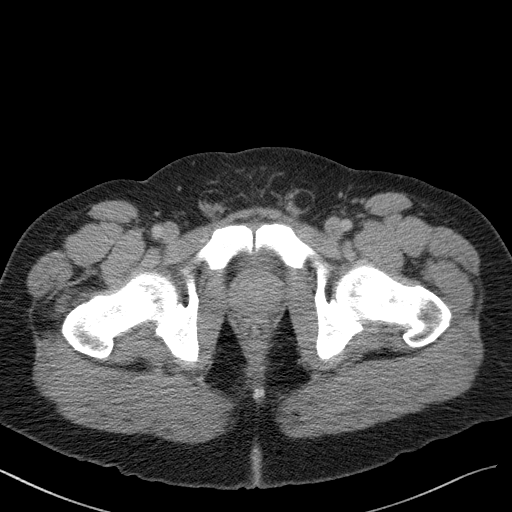
[im 22/102  soft-tissue]
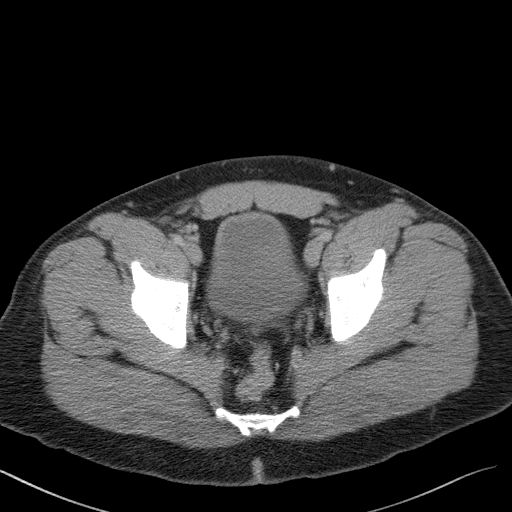
[im 26/102  soft-tissue]
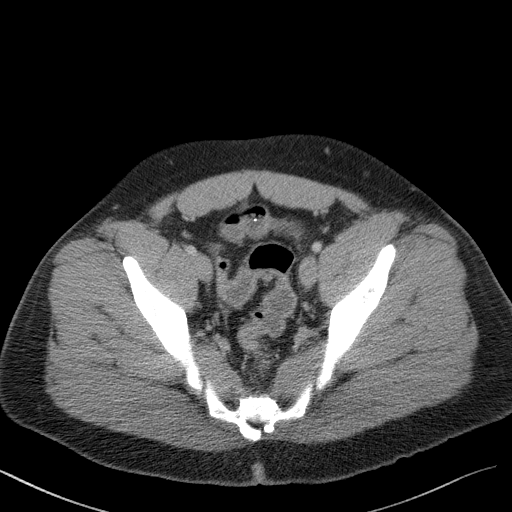
[im 34/102  soft-tissue]
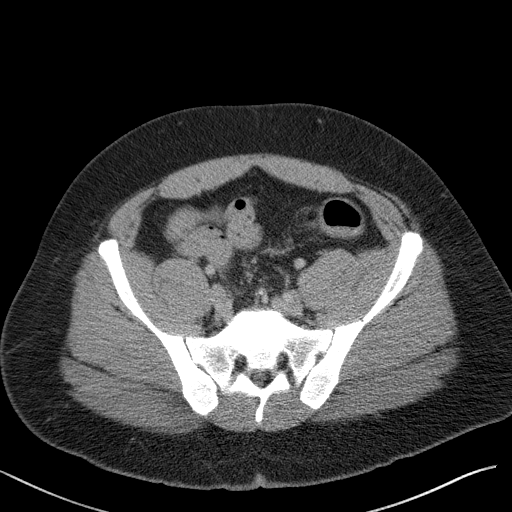
[im 43/102  soft-tissue]
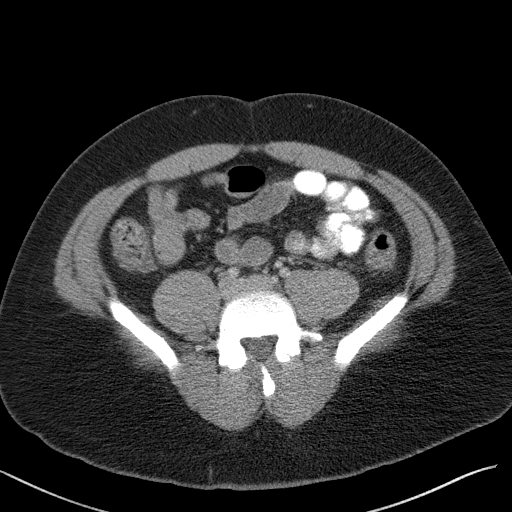
[im 47/102  soft-tissue]
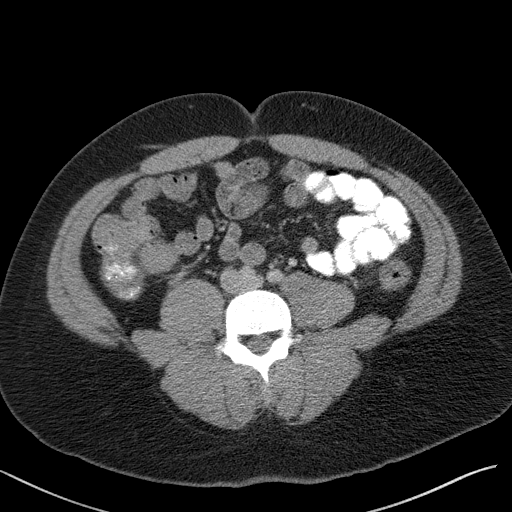
[im 55/102  soft-tissue]
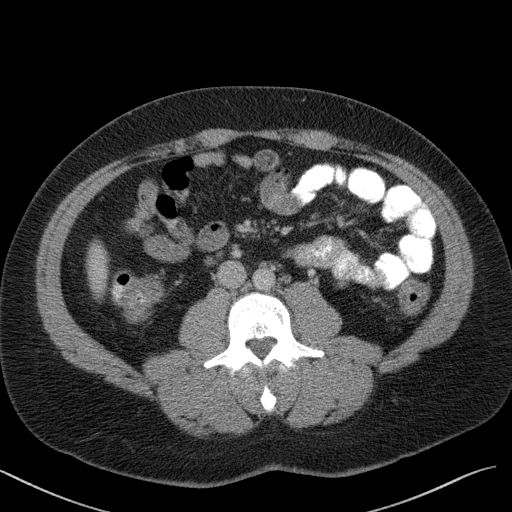
[im 59/102  soft-tissue]
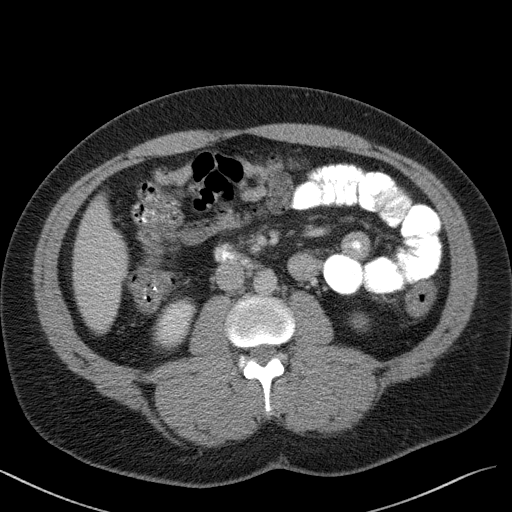
[im 59/102  bone]
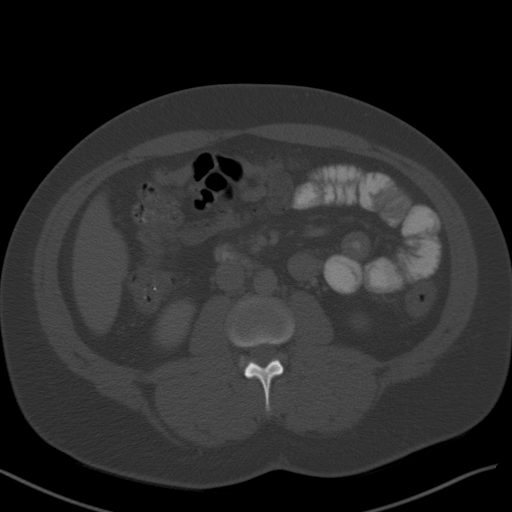
[im 68/102  soft-tissue]
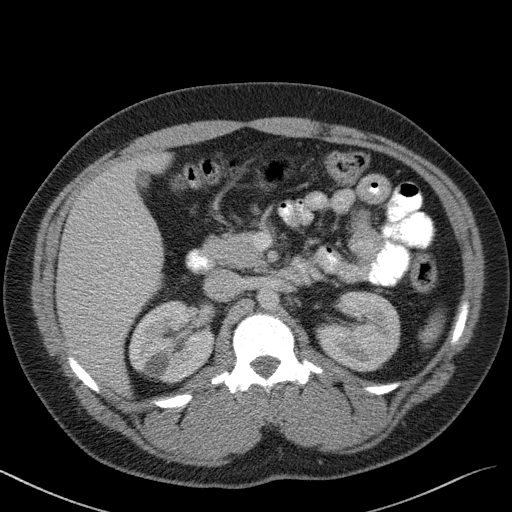
[im 76/102  soft-tissue]
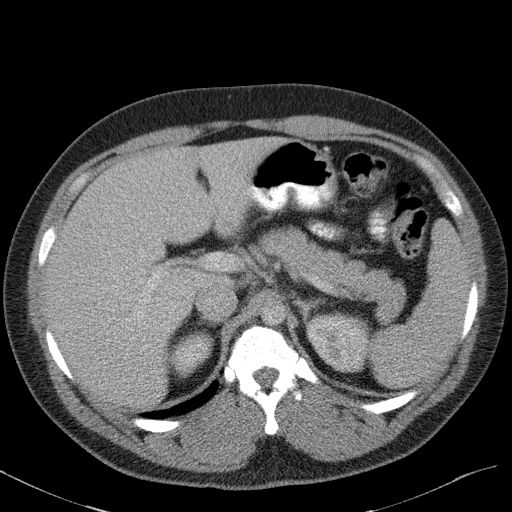
[im 80/102  soft-tissue]
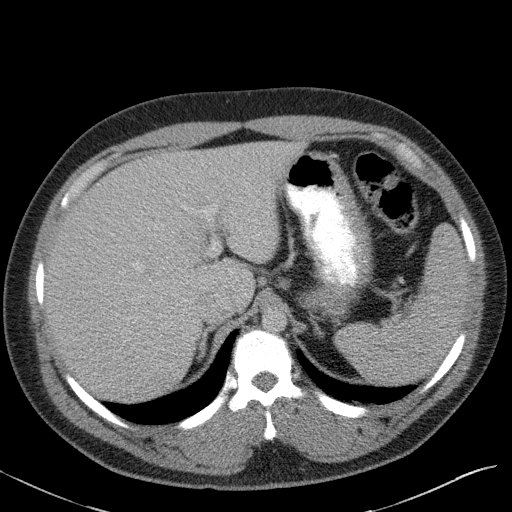
[im 89/102  soft-tissue]
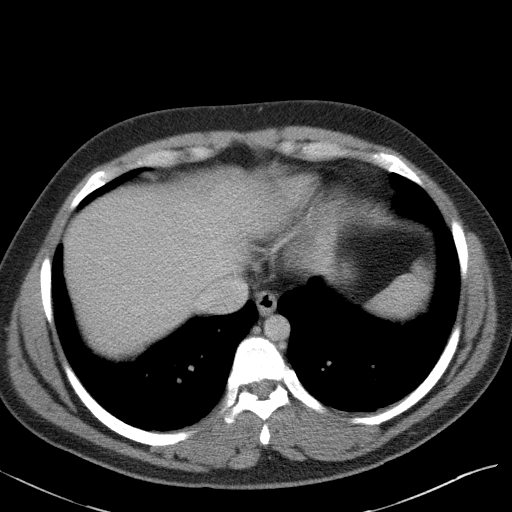
[im 97/102  soft-tissue]
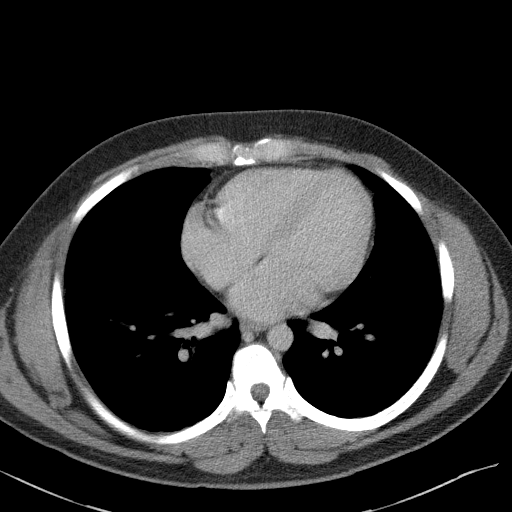

[Series 5: abd/pelvis 3.0 coronal · coronal · 0.94mm/px · 3 of 94 slices shown]
[im 32/94  soft-tissue]
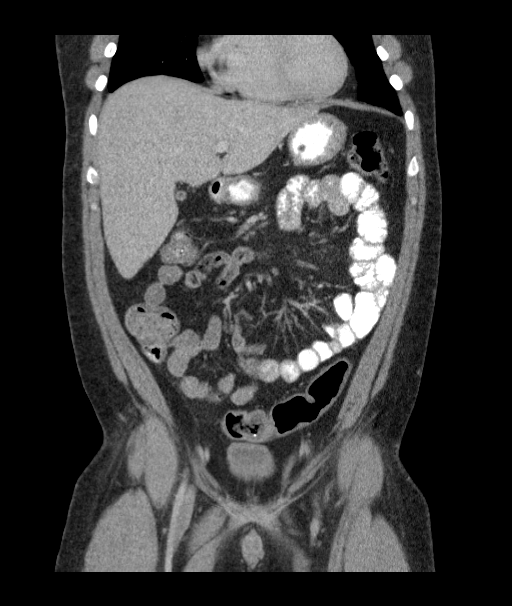
[im 42/94  soft-tissue]
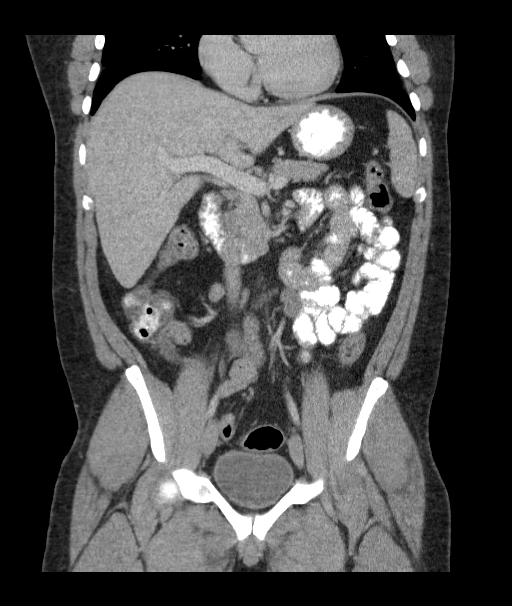
[im 52/94  soft-tissue]
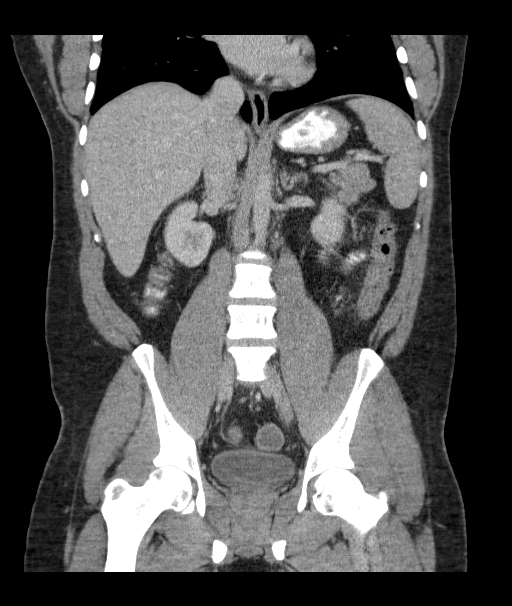

[17 of 46 positions shown; findings below may reference images not displayed]

FINDINGS: BODY WALL: Unremarkable.

LOWER CHEST: Unremarkable.

ABDOMEN/PELVIS:

Liver: No focal abnormality.

Biliary: No evidence of biliary obstruction or stone.

Pancreas: Unremarkable.

Spleen: Unremarkable.

Adrenals: Unremarkable.

Kidneys and ureters: Bilateral approximately 2 cm renal cysts. No
hydronephrosis or nephrolithiasis.

Bladder: Unremarkable.

Reproductive: Unremarkable.

Bowel: Best seen in the mid descending colon, there is
circumferential colonic wall thickening with mild pericolonic fat
infiltration. Despite small volume fluid around the appendix, there
is no appendiceal dilatation or fat edema. Peritoneal fluid is also
noted in the pelvis.

Retroperitoneum: No mass or adenopathy.

Vascular: No acute abnormality.

OSSEOUS: No acute abnormalities.
IMPRESSION: Colitis.  No obstruction or other complication.

## 2019-06-12 ENCOUNTER — Ambulatory Visit: Payer: Managed Care, Other (non HMO) | Admitting: Family Medicine

## 2019-06-19 ENCOUNTER — Ambulatory Visit (INDEPENDENT_AMBULATORY_CARE_PROVIDER_SITE_OTHER): Payer: BC Managed Care – PPO | Admitting: Family Medicine

## 2019-06-19 ENCOUNTER — Encounter: Payer: Self-pay | Admitting: Family Medicine

## 2019-06-19 ENCOUNTER — Other Ambulatory Visit: Payer: Self-pay

## 2019-06-19 VITALS — Ht 70.0 in | Wt 240.0 lb

## 2019-06-19 DIAGNOSIS — R202 Paresthesia of skin: Secondary | ICD-10-CM

## 2019-06-19 DIAGNOSIS — R200 Anesthesia of skin: Secondary | ICD-10-CM

## 2019-06-19 DIAGNOSIS — Z0000 Encounter for general adult medical examination without abnormal findings: Secondary | ICD-10-CM | POA: Diagnosis not present

## 2019-06-19 DIAGNOSIS — N529 Male erectile dysfunction, unspecified: Secondary | ICD-10-CM | POA: Diagnosis not present

## 2019-06-19 DIAGNOSIS — G5603 Carpal tunnel syndrome, bilateral upper limbs: Secondary | ICD-10-CM | POA: Diagnosis not present

## 2019-06-19 DIAGNOSIS — M25531 Pain in right wrist: Secondary | ICD-10-CM

## 2019-06-19 DIAGNOSIS — Z7689 Persons encountering health services in other specified circumstances: Secondary | ICD-10-CM

## 2019-06-19 MED ORDER — PREDNISONE 20 MG PO TABS: ORAL_TABLET | ORAL | 0 refills | Status: DC

## 2019-06-19 MED ORDER — SILDENAFIL CITRATE 50 MG PO TABS: 50.0000 mg | ORAL_TABLET | Freq: Every day | ORAL | 0 refills | Status: DC | PRN

## 2019-06-19 NOTE — Progress Notes (Signed)
Virtual Visit via Video Note  I connected with Chad Alvarez on 06/19/19 at  2:30 PM EST by a video enabled telemedicine application and verified that I am speaking with the correct person using two identifiers. Location patient: home Location provider: work Persons participating in the virtual visit: patient, provider  I discussed the limitations of evaluation and management by telemedicine and the availability of in person appointments. The patient expressed understanding and agreed to proceed.  Chief Complaint  Patient presents with  . Establish Care    Pt c/o carpal tunnel in both hands but rt hand is worse.  Pt said that it has worsened over the last yr.     HPI: Chad Alvarez is a 34 y.o. male new to our office who complains of B/L (Rt > Lt) hand numbness, tingling, pain in hand and wrist, decreased grip strength. He also endorses intermittent swelling over thenar eminence. He thinks swelling is related to how much work he is doing.  He also complains of numbness and tingling in B/L arms Rt > Lt. This occurs moreNo neck pain.  Symptoms present x years but worse since summer 2020. Pt is Rt hand dominant Pt works in Architect.  Pt had tried taking ibuprofen 800mg  and tylenol 1000mg  - minimal relief.  Pt notes 38yr h/o issue with achieving and maintaining and erection. Pt is married, monogamous. Pt denies anxiety, although a h/o anxiety is documented in his chart.  Pt is married, 5 children (18yo-9yo).  Past Medical History:  Diagnosis Date  . Anxiety     History reviewed. No pertinent surgical history.  Family History  Problem Relation Age of Onset  . Diabetes Mother     Social History   Tobacco Use  . Smoking status: Former Smoker    Quit date: 01/16/2013    Years since quitting: 6.4  . Smokeless tobacco: Never Used  Substance Use Topics  . Alcohol use: No  . Drug use: No    No current outpatient medications on file.  No Known Allergies    ROS: See pertinent  positives and negatives per HPI.   EXAM:  VITALS per patient if applicable: Ht 5\' 10"  (1.778 m)   Wt 240 lb (108.9 kg)   BMI 34.44 kg/m    GENERAL: alert, oriented, appears well and in no acute distress  HEENT: atraumatic, conjunctiva clear, no obvious abnormalities on inspection of external nose and ears  NECK: normal movements of the head and neck  LUNGS: on inspection no signs of respiratory distress, breathing rate appears normal, no obvious gross SOB, gasping or wheezing, no conversational dyspnea  CV: no obvious cyanosis  MS: moves all visible extremities without noticeable abnormality  PSYCH/NEURO: pleasant and cooperative, no obvious depression or anxiety, speech and thought processing grossly intact   ASSESSMENT AND PLAN: 1. Bilateral carpal tunnel syndrome 2. Right wrist pain 3. Numbness and tingling Rt arm - some symptome c/w CTS but others not and question if there is ortho/MSK component to symptoms - no improvement with regular use of ibuprofen 800mg  Rx: - predniSONE (DELTASONE) 20 MG tablet; 3 tabs po x 3 days, 2 tabs po x 3 days, 1 tab po x 3 days. 1/2 tab po x 3 days  Dispense: 20 tablet; Refill: 0 - referral to ortho  2. Encounter to establish care with new doctor  3. Erectile dysfunction, unspecified erectile dysfunction type Rx: - sildenafil (VIAGRA) 50 MG tablet; Take 1 tablet (50 mg total) by mouth daily as needed for  erectile dysfunction.  Dispense: 10 tablet; Refill: 0 - Basic metabolic panel; Future - TSH; Future - Testosterone; Future  4. Annual physical exam - pt due and will schedule - ALT; Future - AST; Future - Basic metabolic panel; Future - Lipid panel; Future  Discussed plan and reviewed medications with patient, including risks, benefits, and potential side effects. Pt expressed understand. All questions answered.   I discussed the assessment and treatment plan with the patient. The patient was provided an opportunity to ask  questions and all were answered. The patient agreed with the plan and demonstrated an understanding of the instructions.   The patient was advised to call back or seek an in-person evaluation if the symptoms worsen or if the condition fails to improve as anticipated.   Luana Shu, DO

## 2019-06-26 ENCOUNTER — Other Ambulatory Visit: Payer: Managed Care, Other (non HMO)

## 2019-06-30 ENCOUNTER — Ambulatory Visit (INDEPENDENT_AMBULATORY_CARE_PROVIDER_SITE_OTHER): Payer: BC Managed Care – PPO

## 2019-06-30 ENCOUNTER — Ambulatory Visit: Payer: BC Managed Care – PPO | Admitting: Orthopaedic Surgery

## 2019-06-30 ENCOUNTER — Other Ambulatory Visit: Payer: Self-pay

## 2019-06-30 ENCOUNTER — Encounter: Payer: Self-pay | Admitting: Orthopaedic Surgery

## 2019-06-30 DIAGNOSIS — G8929 Other chronic pain: Secondary | ICD-10-CM

## 2019-06-30 DIAGNOSIS — M79641 Pain in right hand: Secondary | ICD-10-CM

## 2019-06-30 DIAGNOSIS — M79642 Pain in left hand: Secondary | ICD-10-CM

## 2019-06-30 DIAGNOSIS — M25511 Pain in right shoulder: Secondary | ICD-10-CM

## 2019-06-30 DIAGNOSIS — M542 Cervicalgia: Secondary | ICD-10-CM

## 2019-06-30 NOTE — Progress Notes (Signed)
   Office Visit Note   Patient: Chad Alvarez           Date of Birth: [birth year]           MRN: [id number] Visit Date: 06/30/2019              Requested by: Overton Mam, DO [address] [place],  [place] [address] PCP: Overton Mam, DO   Assessment & Plan: Visit Diagnoses:  1. Bilateral hand pain     Plan: patient has somewhat of an abnormal presentation of possible carpal tunnel syndrome and I think most of his symptoms are coming from this rather than his neck.  We will provide the patient with bilateral wrist splints.  We will obtain bilateral upper extremity NCS/EMG.  He will follow up with [place] once completed.  If these are normal, we may consider MRI of the cervical spine.  Follow-Up Instructions: Return for after NCS/EMG.   Orders:  Orders Placed This Encounter  Procedures  . XR Cervical Spine 2 or 3 views   No orders of the defined types were placed in this encounter.     Procedures: No procedures performed   Clinical Data: No additional findings.   Subjective: Chief Complaint  Patient presents with  . Right Hand - Pain  . 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  He comes in today with bilateral hand pain, numbness and tingling for the past 2 years which has progressively worsened.  The pain and numbness typically starts in the parascapular region and radiates down the arm into the thenar eminence.  There are no specific aggravators although he does notice that when he is sitting on the couch watching TV with his arms out in front he has worsening numbness.  He initially tried wearing a copper glove which did help the hand pain for about a year.  Most recently, he has been on prednisone with mild relief of symptoms.  No previous EMG.  No previous cervical bone pathology.  No neck pain.   Review of Systems as detailed in HPI.  All others reviewed and are  negative.   Objective: Vital Signs: There were no vitals taken for this visit.  Physical Exam well-developed well-nourished gentleman no acute distress.  Alert oriented x3.  Ortho Exam examination of his hands reveals minimally positive Tinel on the right negative on the left.  Negative Phalen's both sides.  No thenar atrophy.  He is neurovascular intact distally.  Normal cervical spine exam.  He does have mild tenderness to the right parascapular trigger points.   Specialty Comments:  No specialty comments available.  Imaging: XR Cervical Spine 2 or 3 views  Result Date: 06/30/2019 Abnormal straightening of cervical spine.    PMFS History: There are no problems to display for this patient.  Past Medical History:  Diagnosis Date  . Anxiety     Family History  Problem Relation Age of Onset  . Diabetes Mother     History reviewed. No pertinent surgical history. Social History   Occupational History  . Not on file  Tobacco Use  . Smoking status: Former Smoker    Quit date: 01/16/2013    Years since quitting: 6.4  . Smokeless tobacco: Never Used  Substance and Sexual Activity  . Alcohol use: No  . Drug use: No  . Sexual activity: Not on file

## 2019-07-17 ENCOUNTER — Telehealth: Payer: Self-pay

## 2019-07-17 ENCOUNTER — Ambulatory Visit: Payer: BC Managed Care – PPO | Admitting: Orthopaedic Surgery

## 2019-07-17 ENCOUNTER — Other Ambulatory Visit: Payer: Self-pay

## 2019-07-17 DIAGNOSIS — M79641 Pain in right hand: Secondary | ICD-10-CM

## 2019-07-17 DIAGNOSIS — M79642 Pain in left hand: Secondary | ICD-10-CM

## 2019-07-17 NOTE — Telephone Encounter (Signed)
Thank you :)

## 2019-07-17 NOTE — Telephone Encounter (Signed)
Patient was here today to go over nerve studies, but he never had them. I told him it looked like we had tried to call him a couple times. He asks that we make appt with his wife [phone] He likes Friday mornings if possible

## 2019-07-17 NOTE — Progress Notes (Signed)
rescheduled

## 2019-07-17 NOTE — Telephone Encounter (Signed)
Scheduled

## 2019-07-17 NOTE — Progress Notes (Deleted)
   Office Visit Note   Patient: Chad Alvarez           Date of Birth: 07/30/1985           MRN: [id number] Visit Date: 07/17/2019              Requested by: Overton Mam, DO [address] [place],  [place] [address] PCP: Overton Mam, DO   Assessment & Plan: Visit Diagnoses: No diagnosis found.  Plan: ***  Follow-Up Instructions: No follow-ups on file.   Orders:  No orders of the defined types were placed in this encounter.  No orders of the defined types were placed in this encounter.     Procedures: No procedures performed   Clinical Data: No additional findings.   Subjective: Chief Complaint  Patient presents with  . Right Hand - Pain  . Left Hand - Pain    HPI  Review of Systems   Objective: Vital Signs: There were no vitals taken for this visit.  Physical Exam  Ortho Exam  Specialty Comments:  No specialty comments available.  Imaging: No results found.   PMFS History: There are no problems to display for this patient.  Past Medical History:  Diagnosis Date  . Anxiety     Family History  Problem Relation Age of Onset  . Diabetes Mother     No past surgical history on file. Social History   Occupational History  . Not on file  Tobacco Use  . Smoking status: Former Smoker    Quit date: 01/16/2013    Years since quitting: 6.5  . Smokeless tobacco: Never Used  Substance and Sexual Activity  . Alcohol use: No  . Drug use: No  . Sexual activity: Not on file

## 2019-08-05 ENCOUNTER — Ambulatory Visit (INDEPENDENT_AMBULATORY_CARE_PROVIDER_SITE_OTHER): Payer: BC Managed Care – PPO | Admitting: Physical Medicine and Rehabilitation

## 2019-08-05 ENCOUNTER — Encounter: Payer: Self-pay | Admitting: Physical Medicine and Rehabilitation

## 2019-08-05 ENCOUNTER — Other Ambulatory Visit: Payer: Self-pay

## 2019-08-05 DIAGNOSIS — R202 Paresthesia of skin: Secondary | ICD-10-CM | POA: Diagnosis not present

## 2019-08-05 NOTE — Progress Notes (Signed)
  Numeric Pain Rating Scale and Functional Assessment Average Pain 8   In the last MONTH (on 0-10 scale) has pain interfered with the following?  1. General activity like being  able to carry out your everyday physical activities such as walking, climbing stairs, carrying groceries, or moving a chair?  Rating(9)   .  

## 2019-08-06 NOTE — Procedures (Signed)
EMG & NCV Findings: Evaluation of the right median motor nerve showed decreased conduction velocity (Elbow-Wrist, 49 m/s).  The left median (across palm) sensory nerve showed prolonged distal peak latency (Wrist, 3.7 ms).  All remaining nerves (as indicated in the following tables) were within normal limits.  All left vs. right side differences were within normal limits.    All examined muscles (as indicated in the following table) showed no evidence of electrical instability.    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  This could be temperature artifact.  There is no significant electrodiagnostic evidence of any other focal nerve entrapment, brachial plexopathy or cervical radiculopathy.  As you know, this particular electrodiagnostic study cannot rule out chemical radiculitis or sensory only radiculopathy.  Recommendations: 1.  Follow-up with referring physician. 2.  Continue current management of symptoms.  ___________________________ Laurence Spates FAAPMR Board Certified, American Board of Physical Medicine and Rehabilitation    Nerve Conduction Studies Anti Sensory Summary Table   Stim Site NR Peak (ms) Norm Peak (ms) P-T Amp (V) Norm P-T Amp Site1 Site2 Delta-P (ms) Dist (cm) Vel (m/s) Norm Vel (m/s)  Left Median Acr Palm Anti Sensory (2nd Digit)  31.9C  Wrist    *3.7 <3.6 30.5 >10 Wrist Palm 1.8 0.0    Palm    1.9 <2.0 41.0         Right Median Acr Palm Anti Sensory (2nd Digit)  32C  Wrist    3.5 <3.6 31.7 >10 Wrist Palm 1.7 0.0    Palm    1.8 <2.0 49.8         Left Radial Anti Sensory (Base 1st Digit)  32.1C  Wrist    2.1 <3.1 16.3  Wrist Base 1st Digit 2.1 0.0    Right Radial Anti Sensory (Base 1st Digit)  31.6C  Wrist    2.2 <3.1 15.9  Wrist Base 1st Digit 2.2 0.0    Left Ulnar Anti Sensory (5th Digit)  32.3C  Wrist    3.4 <3.7 20.1 >15.0  Wrist 5th Digit 3.4 14.0 41 >38  Right Ulnar Anti Sensory (5th Digit)  32C  Wrist    3.3 <3.7 27.2 >15.0 Wrist 5th Digit 3.3 14.0 42 >38   Motor Summary Table   Stim Site NR Onset (ms) Norm Onset (ms) O-P Amp (mV) Norm O-P Amp Site1 Site2 Delta-0 (ms) Dist (cm) Vel (m/s) Norm Vel (m/s)  Left Median Motor (Abd Poll Brev)  32.1C  Wrist    3.6 <4.2 11.3 >5 Elbow Wrist 4.2 21.0 50 >50  Elbow    7.8  11.6         Right Median Motor (Abd Poll Brev)  31.6C  Wrist    3.4 <4.2 10.6 >5 Elbow Wrist 4.3 21.0 *49 >50  Elbow    7.7  10.3         Left Ulnar Motor (Abd Dig Min)  32C  Wrist    2.7 <4.2 12.0 >3 B Elbow Wrist 3.9 23.0 59 >53  B Elbow    6.6  11.8  A Elbow B Elbow 1.3 10.5 81 >53  A Elbow    7.9  12.1         Right Ulnar Motor (Abd Dig Min)  31.8C  Wrist    2.7 <4.2 11.9 >3 B Elbow Wrist 3.9 23.0 59 >53  B Elbow    6.6  9.8  A Elbow B Elbow 1.3 11.0 85 >53  A Elbow    7.9  11.8          EMG   Side Muscle Nerve Root Ins Act Fibs Psw Amp Dur Poly Recrt Int Dennie Bible Comment  Right Abd Poll Brev Median C8-T1 Nml Nml Nml Nml Nml 0 Nml Nml   Right 1stDorInt Ulnar C8-T1 Nml Nml Nml Nml Nml 0 Nml Nml   Right PronatorTeres Median C6-7 Nml Nml Nml Nml Nml 0 Nml Nml   Right Biceps Musculocut C5-6 Nml Nml Nml Nml Nml 0 Nml Nml   Right Deltoid Axillary C5-6 Nml Nml Nml Nml Nml 0 Nml Nml     Nerve Conduction Studies Anti Sensory Left/Right Comparison   Stim Site L Lat (ms) R Lat (ms) L-R Lat (ms) L Amp (V) R Amp (V) L-R Amp (%) Site1 Site2 L Vel (m/s) R Vel (m/s) L-R Vel (m/s)  Median Acr Palm Anti Sensory (2nd Digit)  31.9C  Wrist *3.7 3.5 0.2 30.5 31.7 3.8 Wrist Palm     Palm 1.9 1.8 0.1 41.0 49.8 17.7       Radial Anti Sensory (Base 1st Digit)  32.1C  Wrist 2.1 2.2 0.1 16.3 15.9 2.5 Wrist Base 1st Digit     Ulnar Anti Sensory (5th Digit)  32.3C  Wrist 3.4 3.3 0.1 20.1 27.2 26.1 Wrist 5th Digit 41 42 1   Motor Left/Right Comparison   Stim Site L Lat (ms) R Lat (ms) L-R Lat (ms) L  Amp (mV) R Amp (mV) L-R Amp (%) Site1 Site2 L Vel (m/s) R Vel (m/s) L-R Vel (m/s)  Median Motor (Abd Poll Brev)  32.1C  Wrist 3.6 3.4 0.2 11.3 10.6 6.2 Elbow Wrist 50 *49 1  Elbow 7.8 7.7 0.1 11.6 10.3 11.2       Ulnar Motor (Abd Dig Min)  32C  Wrist 2.7 2.7 0.0 12.0 11.9 0.8 B Elbow Wrist 59 59 0  B Elbow 6.6 6.6 0.0 11.8 9.8 16.9 A Elbow B Elbow 81 85 4  A Elbow 7.9 7.9 0.0 12.1 11.8 2.5          Waveforms:

## 2019-08-06 NOTE — Progress Notes (Signed)
Chad Alvarez - 34 y.o. male MRN [id number]  Date of birth: 01/12/1986  Office Visit Note: Visit Date: 08/05/2019 PCP: Overton Mam, DO Referred by: Overton Mam, DO  Subjective: Chief Complaint  Patient presents with  . Right Hand - Pain, Numbness  . Right Arm - Pain, Numbness  . Left Hand - Pain, Numbness   HPI: Chad Alvarez is a 34 y.o. male who comes in today At the request of Dr. Glee Arvin for electrodiagnostic studies of both upper limbs.  He is right-hand dominant and reports several years of pain and swelling in both arms particularly in the right arm and hand in a nondermatomal pattern which is all fingers except the thumb.  In the left hand he feels this more in the palm of the hand only.  He does get a lot of swelling and he reports this is physical swelling where he can even put a glove on because the hand is swollen.  He has symptoms that radiate up into the forearm with numbness.  He reports worsening with using his hands and driving.  He reports some relief with CBD cream and topical rubs.  He has not had prior electrodiagnostic studies.  He rates his pain as an 8 out of 10.  He does not report frank radicular symptoms but he has had some neck pain in the past.  He does work pretty physical job with his hands.  ROS Otherwise per HPI.  Assessment & Plan: Visit Diagnoses:  1. 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  This could be temperature artifact.  There is no significant electrodiagnostic evidence of any other focal nerve entrapment, brachial plexopathy or cervical radiculopathy.  As you know, this particular electrodiagnostic study cannot rule out chemical radiculitis or sensory only radiculopathy.  Recommendations: 1.  Follow-up with referring physician. 2.  Continue current management of  symptoms.  Meds & Orders: No orders of the defined types were placed in this encounter.   Orders Placed This Encounter  Procedures  . NCV with EMG (electromyography)    Follow-up: Return for Glee Arvin, MD.   Procedures: No procedures performed  EMG & NCV Findings: Evaluation of the right median motor nerve showed decreased conduction velocity (Elbow-Wrist, 49 m/s).  The left median (across palm) sensory nerve showed prolonged distal peak latency (Wrist, 3.7 ms).  All remaining nerves (as indicated in the following tables) were within normal limits.  All left vs. right side differences were within normal limits.    All examined muscles (as indicated in the following table) showed no evidence of electrical instability.    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  This could be temperature artifact.  There is no significant electrodiagnostic evidence of any other focal nerve entrapment, brachial plexopathy or cervical radiculopathy.  As you know, this particular electrodiagnostic study cannot rule out chemical radiculitis or sensory only radiculopathy.  Recommendations: 1.  Follow-up with referring physician. 2.  Continue current management of symptoms.  ___________________________ Elease Hashimoto Board Certified, American Board of Physical Medicine and Rehabilitation    Nerve Conduction Studies Anti Sensory Summary Table   Stim Site NR Peak (ms) Norm Peak (ms) P-T Amp (V) Norm P-T Amp Site1 Site2 Delta-P (ms) Dist (  cm) Vel (m/s) Norm Vel (m/s)  Left Median Acr Palm Anti Sensory (2nd Digit)  31.9C  Wrist    *3.7 <3.6 30.5 >10 Wrist Palm 1.8 0.0    Palm    1.9 <2.0 41.0         Right Median Acr Palm Anti Sensory (2nd Digit)  32C  Wrist    3.5 <3.6 31.7 >10 Wrist Palm 1.7 0.0    Palm    1.8 <2.0 49.8         Left Radial Anti Sensory (Base 1st  Digit)  32.1C  Wrist    2.1 <3.1 16.3  Wrist Base 1st Digit 2.1 0.0    Right Radial Anti Sensory (Base 1st Digit)  31.6C  Wrist    2.2 <3.1 15.9  Wrist Base 1st Digit 2.2 0.0    Left Ulnar Anti Sensory (5th Digit)  32.3C  Wrist    3.4 <3.7 20.1 >15.0 Wrist 5th Digit 3.4 14.0 41 >38  Right Ulnar Anti Sensory (5th Digit)  32C  Wrist    3.3 <3.7 27.2 >15.0 Wrist 5th Digit 3.3 14.0 42 >38   Motor Summary Table   Stim Site NR Onset (ms) Norm Onset (ms) O-P Amp (mV) Norm O-P Amp Site1 Site2 Delta-0 (ms) Dist (cm) Vel (m/s) Norm Vel (m/s)  Left Median Motor (Abd Poll Brev)  32.1C  Wrist    3.6 <4.2 11.3 >5 Elbow Wrist 4.2 21.0 50 >50  Elbow    7.8  11.6         Right Median Motor (Abd Poll Brev)  31.6C  Wrist    3.4 <4.2 10.6 >5 Elbow Wrist 4.3 21.0 *49 >50  Elbow    7.7  10.3         Left Ulnar Motor (Abd Dig Min)  32C  Wrist    2.7 <4.2 12.0 >3 B Elbow Wrist 3.9 23.0 59 >53  B Elbow    6.6  11.8  A Elbow B Elbow 1.3 10.5 81 >53  A Elbow    7.9  12.1         Right Ulnar Motor (Abd Dig Min)  31.8C  Wrist    2.7 <4.2 11.9 >3 B Elbow Wrist 3.9 23.0 59 >53  B Elbow    6.6  9.8  A Elbow B Elbow 1.3 11.0 85 >53  A Elbow    7.9  11.8          EMG   Side Muscle Nerve Root Ins Act Fibs Psw Amp Dur Poly Recrt Int Fraser Din Comment  Right Abd Poll Brev Median C8-T1 Nml Nml Nml Nml Nml 0 Nml Nml   Right 1stDorInt Ulnar C8-T1 Nml Nml Nml Nml Nml 0 Nml Nml   Right PronatorTeres Median C6-7 Nml Nml Nml Nml Nml 0 Nml Nml   Right Biceps Musculocut C5-6 Nml Nml Nml Nml Nml 0 Nml Nml   Right Deltoid Axillary C5-6 Nml Nml Nml Nml Nml 0 Nml Nml     Nerve Conduction Studies Anti Sensory Left/Right Comparison   Stim Site L Lat (ms) R Lat (ms) L-R Lat (ms) L Amp (V) R Amp (V) L-R Amp (%) Site1 Site2 L Vel (m/s) R Vel (m/s) L-R Vel (m/s)  Median Acr Palm Anti Sensory (2nd Digit)  31.9C  Wrist *3.7 3.5 0.2 30.5 31.7 3.8 Wrist Palm     Palm 1.9 1.8 0.1 41.0 49.8 17.7       Radial Anti Sensory (Base  1st Digit)  32.1C  Wrist 2.1 2.2 0.1 16.3 15.9 2.5 Wrist Base 1st Digit     Ulnar Anti Sensory (5th Digit)  32.3C  Wrist 3.4 3.3 0.1 20.1 27.2 26.1 Wrist 5th Digit 41 42 1   Motor Left/Right Comparison   Stim Site L Lat (ms) R Lat (ms) L-R Lat (ms) L Amp (mV) R Amp (mV) L-R Amp (%) Site1 Site2 L Vel (m/s) R Vel (m/s) L-R Vel (m/s)  Median Motor (Abd Poll Brev)  32.1C  Wrist 3.6 3.4 0.2 11.3 10.6 6.2 Elbow Wrist 50 *49 1  Elbow 7.8 7.7 0.1 11.6 10.3 11.2       Ulnar Motor (Abd Dig Min)  32C  Wrist 2.7 2.7 0.0 12.0 11.9 0.8 B Elbow Wrist 59 59 0  B Elbow 6.6 6.6 0.0 11.8 9.8 16.9 A Elbow B Elbow 81 85 4  A Elbow 7.9 7.9 0.0 12.1 11.8 2.5          Waveforms:                      Clinical History: No specialty comments available.   He reports that he quit smoking about 6 years ago. He has never used smokeless tobacco. No results for input(s): HGBA1C, LABURIC in the last 8760 hours.  Objective:  VS:  HT:    WT:   BMI:     BP:   HR: bpm  TEMP: ( )  RESP:  Physical Exam Musculoskeletal:        General: No tenderness.     Comments: Inspection reveals no atrophy of the bilateral APB or FDI or hand intrinsics. There is no swelling, color changes, allodynia or dystrophic changes. There is 5 out of 5 strength in the bilateral wrist extension, finger abduction and long finger flexion. There is intact sensation to light touch in all dermatomal and peripheral nerve distributions.  There is a negative Hoffmann's test bilaterally.  Skin:    General: Skin is warm and dry.     Findings: No erythema or rash.  Neurological:     General: No focal deficit present.     Mental Status: He is alert and oriented to person, place, and time.     Sensory: No sensory deficit.     Motor: No weakness or abnormal muscle tone.     Coordination: Coordination normal.     Gait: Gait normal.  Psychiatric:        Mood and Affect: Mood normal.        Behavior: Behavior normal.        Thought  Content: Thought content normal.     Ortho Exam Imaging: No results found.  Past Medical/Family/Surgical/Social History: Medications & Allergies reviewed per EMR, new medications updated. There are no problems to display for this patient.  Past Medical History:  Diagnosis Date  . Anxiety    Family History  Problem Relation Age of Onset  . Diabetes Mother    History reviewed. No pertinent surgical history. Social History   Occupational History  . Not on file  Tobacco Use  . Smoking status: Former Smoker    Quit date: 01/16/2013    Years since quitting: 6.5  . Smokeless tobacco: Never Used  Substance and Sexual Activity  . Alcohol use: No  . Drug use: No  . Sexual activity: Not on file

## 2019-08-12 ENCOUNTER — Encounter: Payer: Self-pay | Admitting: Orthopaedic Surgery

## 2019-08-12 ENCOUNTER — Other Ambulatory Visit: Payer: Self-pay

## 2019-08-12 ENCOUNTER — Ambulatory Visit: Payer: BC Managed Care – PPO | Admitting: Orthopaedic Surgery

## 2019-08-12 ENCOUNTER — Ambulatory Visit: Payer: Self-pay

## 2019-08-12 DIAGNOSIS — G5602 Carpal tunnel syndrome, left upper limb: Secondary | ICD-10-CM | POA: Diagnosis not present

## 2019-08-12 DIAGNOSIS — M79641 Pain in right hand: Secondary | ICD-10-CM | POA: Diagnosis not present

## 2019-08-12 NOTE — Progress Notes (Signed)
Office Visit Note   Patient: Chad Alvarez           Date of Birth: 1985-09-08           MRN: [id number] Visit Date: 08/12/2019              Requested by: Overton Mam, DO [address] [place],  [place] [address] PCP: Overton Mam, DO   Assessment & Plan: Visit Diagnoses:  1. Pain of right hand   2. 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  Pennsaid sample was provided today.  Patient is asymptomatic with the left hand therefore no intervention needed at this time.  Relative rest for the right hand.  Questions encouraged and answered.  Follow-up as needed.  Follow-Up Instructions: Return if symptoms worsen or fail to improve.   Orders:  Orders Placed This Encounter  Procedures  . XR Hand Complete Right   No orders of the defined types were placed in this encounter.     Procedures: No procedures performed   Clinical Data: No additional findings.   Subjective: Chief Complaint  Patient presents with  . Right Hand - Pain  . Left Hand - Pain    Chad Alvarez returns today for nerve conduction studies review.  The left hand shows a borderline mild left carpal tunnel syndrome.  His right hand is actually more symptomatic.  He states that it swells and it hurts after a day of work.  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  His left hand actually feels pretty good.   Review of Systems  Constitutional: Negative.   All other systems reviewed and are negative.    Objective: Vital Signs: There were no vitals taken for this visit.  Physical Exam Vitals and nursing note reviewed.  Constitutional:      Appearance: He is well-developed.  Pulmonary:     Effort: Pulmonary effort is normal.  Abdominal:     Palpations: Abdomen is soft.  Skin:    General: Skin is warm.  Neurological:     Mental Status: He is alert and oriented to person,  place, and time.  Psychiatric:        Behavior: Behavior normal.        Thought Content: Thought content normal.        Judgment: Judgment normal.     Ortho Exam Right hand exam shows no masses or lesions or swelling or warmth.  Joints are nontender.  He is able to make a full composite fist. Specialty Comments:  No specialty comments available.  Imaging: XR Hand Complete Right  Result Date: 08/12/2019 No acute or structural abnormalities.    PMFS History: Patient Active Problem List   Diagnosis Date Noted  . Pain of right hand 08/12/2019  . Left carpal tunnel syndrome 08/12/2019   Past Medical History:  Diagnosis Date  . Anxiety     Family History  Problem Relation Age of Onset  . Diabetes Mother     History reviewed. No pertinent surgical history. Social History   Occupational History  . Not on file  Tobacco Use  . Smoking status: Former Smoker    Quit date: 01/16/2013    Years since quitting: 6.5  . Smokeless tobacco: Never Used  Substance and Sexual Activity  . Alcohol use: No  . Drug use: No  . Sexual activity: Not  on file

## 2020-02-15 ENCOUNTER — Encounter: Payer: Self-pay | Admitting: Physical Medicine and Rehabilitation

## 2020-02-15 ENCOUNTER — Other Ambulatory Visit: Payer: Self-pay | Admitting: Physician Assistant

## 2020-02-15 MED ORDER — PREDNISONE 10 MG (21) PO TBPK: ORAL_TABLET | ORAL | 0 refills | Status: DC

## 2020-02-15 NOTE — Telephone Encounter (Signed)
I sent in a sterapred taper.  If this does not help or if it does and his pain returns, have him come back in to be seen please

## 2020-06-30 DIAGNOSIS — M79672 Pain in left foot: Secondary | ICD-10-CM | POA: Diagnosis not present

## 2020-06-30 DIAGNOSIS — M25572 Pain in left ankle and joints of left foot: Secondary | ICD-10-CM | POA: Diagnosis not present

## 2020-07-22 ENCOUNTER — Other Ambulatory Visit: Payer: Self-pay

## 2020-07-22 ENCOUNTER — Encounter: Payer: Self-pay | Admitting: Family Medicine

## 2020-07-22 ENCOUNTER — Ambulatory Visit (INDEPENDENT_AMBULATORY_CARE_PROVIDER_SITE_OTHER): Payer: BC Managed Care – PPO | Admitting: Family Medicine

## 2020-07-22 VITALS — BP 114/76 | HR 94 | Temp 98.2°F | Ht 69.0 in | Wt 270.2 lb

## 2020-07-22 DIAGNOSIS — Z0000 Encounter for general adult medical examination without abnormal findings: Secondary | ICD-10-CM | POA: Diagnosis not present

## 2020-07-22 DIAGNOSIS — N529 Male erectile dysfunction, unspecified: Secondary | ICD-10-CM

## 2020-07-22 DIAGNOSIS — Z13220 Encounter for screening for lipoid disorders: Secondary | ICD-10-CM | POA: Diagnosis not present

## 2020-07-22 LAB — BASIC METABOLIC PANEL
BUN: 21 mg/dL (ref 6–23)
CO2: 26 mEq/L (ref 19–32)
Calcium: 9.3 mg/dL (ref 8.4–10.5)
Chloride: 103 mEq/L (ref 96–112)
Creatinine, Ser: 1.32 mg/dL (ref 0.40–1.50)
GFR: 69.32 mL/min (ref >60.00)
Glucose, Bld: 99 mg/dL (ref 70–99)
Potassium: 4.2 mEq/L (ref 3.5–5.1)
Sodium: 138 mEq/L (ref 135–145)

## 2020-07-22 LAB — CBC
HCT: 46.1 % (ref 39.0–52.0)
Hemoglobin: 15.7 g/dL (ref 13.0–17.0)
MCHC: 34 g/dL (ref 30.0–36.0)
MCV: 84.4 fl (ref 78.0–100.0)
Platelets: 250 10*3/uL (ref 150.0–400.0)
RBC: 5.47 Mil/uL (ref 4.22–5.81)
RDW: 15.1 % (ref 11.5–15.5)
WBC: 8.3 10*3/uL (ref 4.0–10.5)

## 2020-07-22 LAB — LIPID PANEL
Cholesterol: 156 mg/dL (ref 0–200)
HDL: 58.1 mg/dL (ref >39.00)
LDL Cholesterol: 87 mg/dL (ref 0–99)
NonHDL: 97.7
Total CHOL/HDL Ratio: 3
Triglycerides: 52 mg/dL (ref 0.0–149.0)
VLDL: 10.4 mg/dL (ref 0.0–40.0)

## 2020-07-22 LAB — ALT: ALT: 59 U/L — ABNORMAL HIGH (ref 0–53)

## 2020-07-22 LAB — AST: AST: 28 U/L (ref 0–37)

## 2020-07-22 MED ORDER — SILDENAFIL CITRATE 100 MG PO TABS: 100.0000 mg | ORAL_TABLET | Freq: Every day | ORAL | 2 refills | Status: DC | PRN

## 2020-07-22 NOTE — Progress Notes (Addendum)
Lionell Matuszak is a 35 y.o. male  Chief Complaint  Patient presents with  . Annual Exam    CPE/labs.  No concerns.      HPI: Ej Pinson is a 35 y.o. male seen today for annual CPE, fasting labs. He has no acute issues or concerns today.  Dental: UTD Vision: does not wear glasses or contacts but is having trouble driving/see at night. Has appt for eye exam Diet/Exercise: in boot (Lt foot) so no regular exercise; pt does not eat large portions and eats 1-2x/day. He drinks a lot of coffee, does drink water. Job is physical/manual labor  Med refills needed today: none  Past Medical History:  Diagnosis Date  . Anxiety     History reviewed. No pertinent surgical history.  Social History   Socioeconomic History  . Marital status: Married    Spouse name: Not on file  . Number of children: Not on file  . Years of education: Not on file  . Highest education level: Not on file  Occupational History  . Not on file  Tobacco Use  . Smoking status: Former Smoker    Quit date: 01/16/2013    Years since quitting: 7.5  . Smokeless tobacco: Never Used  Substance and Sexual Activity  . Alcohol use: No  . Drug use: No  . Sexual activity: Not on file  Other Topics Concern  . Not on file  Social History Narrative  . Not on file   Social Determinants of Health   Financial Resource Strain: Not on file  Food Insecurity: Not on file  Transportation Needs: Not on file  Physical Activity: Not on file  Stress: Not on file  Social Connections: Not on file  Intimate Partner Violence: Not on file    Family History  Problem Relation Age of Onset  . Diabetes Mother      Immunization History  Administered Date(s) Administered  . Influenza,inj,Quad PF,6+ Mos 03/04/2019  . Influenza-Unspecified 06/13/2020  . PFIZER(Purple Top)SARS-COV-2 Vaccination 10/20/2019, 11/17/2019, 05/18/2020    Outpatient Encounter Medications as of 07/22/2020  Medication Sig  . naproxen (NAPROSYN) 500 MG  tablet SMARTSIG:1 Tablet(s) By Mouth Every 12 Hours PRN  . sildenafil (VIAGRA) 50 MG tablet Take 1 tablet (50 mg total) by mouth daily as needed for erectile dysfunction.  . predniSONE (STERAPRED UNI-PAK 21 TAB) 10 MG (21) TBPK tablet Take as directed (Patient not taking: Reported on 07/22/2020)   No facility-administered encounter medications on file as of 07/22/2020.     ROS: Gen: no fever, chills  Skin: no rash, itching ENT: no ear pain, ear drainage, nasal congestion, rhinorrhea, sinus pressure, sore throat Eyes: no blurry vision, double vision Resp: no cough, wheeze,SOB CV: no CP, palpitations, LE edema,  GI: no heartburn, n/v/d/c, abd pain GU: no dysuria, urgency, frequency, hematuria; no testicular swelling or masses MSK: Lt foot pain - in cam boot since 06/30/20 Neuro: no dizziness, headache, weakness, vertigo Psych: no depression, anxiety, insomnia   No Known Allergies  BP 114/76   Pulse 94   Temp 98.2 F (36.8 C) (Temporal)   Ht 5\' 9"  (1.753 m)   Wt 270 lb 3.2 oz (122.6 kg)   SpO2 98%   BMI 39.90 kg/m   Wt Readings from Last 3 Encounters:  07/22/20 270 lb 3.2 oz (122.6 kg)  06/19/19 240 lb (108.9 kg)  12/05/13 224 lb (101.6 kg)   Temp Readings from Last 3 Encounters:  07/22/20 98.2 F (36.8 C) (Temporal)  12/05/13  98.1 F (36.7 C) (Oral)  01/19/13 98.3 F (36.8 C) (Oral)   BP Readings from Last 3 Encounters:  07/22/20 114/76  12/05/13 113/64  01/19/13 108/60   Pulse Readings from Last 3 Encounters:  07/22/20 94  12/05/13 61  01/19/13 60     Physical Exam Constitutional:      General: He is not in acute distress.    Appearance: He is well-developed and well-nourished.  HENT:     Head: Normocephalic and atraumatic.     Right Ear: Tympanic membrane and ear canal normal.     Left Ear: Tympanic membrane and ear canal normal.     Nose: Nose normal.     Mouth/Throat:     Mouth: Oropharynx is clear and moist and mucous membranes are normal. Mucous  membranes are moist.     Pharynx: Oropharynx is clear.  Eyes:     Conjunctiva/sclera: Conjunctivae normal.  Neck:     Thyroid: No thyromegaly.  Cardiovascular:     Rate and Rhythm: Normal rate and regular rhythm.     Pulses: Intact distal pulses.     Heart sounds: Normal heart sounds.  Pulmonary:     Effort: Pulmonary effort is normal.     Breath sounds: Normal breath sounds. No wheezing or rhonchi.  Abdominal:     General: Bowel sounds are normal. There is no distension.     Palpations: Abdomen is soft. There is no mass.     Tenderness: There is no abdominal tenderness. There is no guarding or rebound.  Musculoskeletal:        General: Signs of injury (Lt foot in boot) present.     Cervical back: Neck supple.     Right lower leg: No edema.  Lymphadenopathy:     Cervical: No cervical adenopathy.  Skin:    General: Skin is warm and dry.  Neurological:     Mental Status: He is alert and oriented to person, place, and time.     Motor: No abnormal muscle tone.     Coordination: Coordination normal.  Psychiatric:        Mood and Affect: Mood and affect and mood normal.        Behavior: Behavior normal.      A/P:  1. Annual physical exam - discussed importance of regular CV exercise, healthy diet, adequate sleep - UTD on dentist, has vision appt schedule - UTD on immunizations - ALT - AST - Basic metabolic panel - CBC - next CPE appt in 1 year  2. Screening for lipid disorders - Lipid panel  3. Erectile dysfunction, unspecified erectile dysfunction type - 50mg  daily not effective Increase: - sildenafil (VIAGRA) 100 MG tablet; Take 1 tablet (100 mg total) by mouth daily as needed for erectile dysfunction.  Dispense: 10 tablet; Refill: 2 - f/u PRN  This visit occurred during the SARS-CoV-2 public health emergency.  Safety protocols were in place, including screening questions prior to the visit, additional usage of staff PPE, and extensive cleaning of exam room while  observing appropriate contact time as indicated for disinfecting solutions.

## 2020-07-22 NOTE — Addendum Note (Signed)
Addended by: Overton Mam on: 07/22/2020 09:57 AM   Modules accepted: Orders

## 2022-12-11 DIAGNOSIS — Y990 Civilian activity done for income or pay: Secondary | ICD-10-CM | POA: Diagnosis not present

## 2022-12-11 DIAGNOSIS — Z23 Encounter for immunization: Secondary | ICD-10-CM | POA: Diagnosis not present

## 2022-12-11 DIAGNOSIS — S61011A Laceration without foreign body of right thumb without damage to nail, initial encounter: Secondary | ICD-10-CM | POA: Diagnosis not present

## 2022-12-11 DIAGNOSIS — S61111A Laceration without foreign body of right thumb with damage to nail, initial encounter: Secondary | ICD-10-CM | POA: Diagnosis not present

## 2022-12-11 DIAGNOSIS — W268XXA Contact with other sharp object(s), not elsewhere classified, initial encounter: Secondary | ICD-10-CM | POA: Diagnosis not present

## 2023-01-04 DIAGNOSIS — M7711 Lateral epicondylitis, right elbow: Secondary | ICD-10-CM | POA: Diagnosis not present

## 2023-02-08 ENCOUNTER — Encounter: Payer: BC Managed Care – PPO | Admitting: Family Medicine

## 2023-03-01 ENCOUNTER — Encounter: Payer: BC Managed Care – PPO | Admitting: Family Medicine

## 2023-03-04 ENCOUNTER — Encounter: Payer: Self-pay | Admitting: Family Medicine

## 2023-03-04 ENCOUNTER — Ambulatory Visit: Payer: BC Managed Care – PPO | Admitting: Family Medicine

## 2023-03-04 VITALS — BP 126/74 | HR 88 | Temp 98.4°F | Ht 69.0 in | Wt 256.0 lb

## 2023-03-04 DIAGNOSIS — M7711 Lateral epicondylitis, right elbow: Secondary | ICD-10-CM | POA: Insufficient documentation

## 2023-03-04 DIAGNOSIS — E6609 Other obesity due to excess calories: Secondary | ICD-10-CM | POA: Diagnosis not present

## 2023-03-04 DIAGNOSIS — Z6837 Body mass index (BMI) 37.0-37.9, adult: Secondary | ICD-10-CM

## 2023-03-04 DIAGNOSIS — N529 Male erectile dysfunction, unspecified: Secondary | ICD-10-CM | POA: Insufficient documentation

## 2023-03-04 DIAGNOSIS — E66812 Obesity, class 2: Secondary | ICD-10-CM | POA: Insufficient documentation

## 2023-03-04 MED ORDER — NAPROXEN 500 MG PO TABS: 500.0000 mg | ORAL_TABLET | Freq: Two times a day (BID) | ORAL | 0 refills | Status: DC

## 2023-03-04 NOTE — Assessment & Plan Note (Signed)
Recommend physical therapy, but Chad Alvarez is worried how taking time for this could lead to ultimate termination. I recommend for now relative rest, applying ice each evenign, using his tennis elbow strap, and a 7-14 day course of naproxen.

## 2023-03-04 NOTE — Patient Instructions (Signed)
Tennis Elbow  Tennis elbow (lateral epicondylitis) is inflammation of tendons in your outer forearm, near your elbow. Tendons are tissues that connect muscle to bone. When you have tennis elbow, inflammation affects the tendons that you use to bend your wrist and move your hand up. Inflammation occurs in the lower part of the upper arm bone (humerus), where the tendons connect to the bone (lateral epicondyle). Tennis elbow often affects people who play tennis, but anyone may get the condition from repeatedly extending the wrist or turning the forearm. What are the causes? This condition is usually caused by repeatedly extending the wrist, turning the forearm, and using the hands. It can result from sports or work that requires repetitive forearm movements. In some cases, it may be caused by a sudden injury. What increases the risk? You are more likely to develop tennis elbow if you play tennis or another racket sport. You also have a higher risk if you frequently use your hands for work. Besides people who play tennis, others at greater risk include: People who use computers. Construction workers. People who work in factories. Musicians. Cooks. Cashiers. What are the signs or symptoms? Symptoms of this condition include: Pain and tenderness in the forearm and the outer part of the elbow. Pain may be felt only when using the arm, or it may be there all the time. A burning feeling that starts in the elbow and spreads down the forearm. A weak grip in the hand. How is this diagnosed? This condition is diagnosed based on your symptoms, your medical history, and a physical exam. You may also have X-rays or an MRI to: Confirm the diagnosis. Look for other issues. Check for tears in the ligaments, muscles, or tendons. How is this treated? Resting and icing your arm is often the first treatment. Your health care provider may also recommend: Medicines to reduce pain and inflammation. These may be in  the form of a pill, topical gels, or shots of a steroid medicine (cortisone). An elbow strap to reduce stress on the area. Physical therapy. This may include massage or exercises or both. An elbow brace to restrict the movements that cause symptoms. If these treatments do not help relieve your symptoms, your health care provider may recommend surgery to remove damaged muscle and reattach healthy muscle to bone. Follow these instructions at home: If you have a brace or strap: Wear the brace or strap as told by your health care provider. Remove it only as told by your health care provider. Check the skin around the brace or strap every day. Tell your health care provider about any concerns. Loosen the brace if your fingers tingle, become numb, or turn cold and blue. Keep the brace clean. If the brace or strap is not waterproof: Do not let it get wet. Cover it with a watertight covering when you take a bath or a shower. Managing pain, stiffness, and swelling  If directed, put ice on the injured area. To do this: If you have a removable brace or strap, remove it as told by your health care provider. Put ice in a plastic bag. Place a towel between your skin and the bag. Leave the ice on for 20 minutes, 2-3 times a day. Remove the ice if your skin turns bright red. This is very important. If you cannot feel pain, heat, or cold, you have a greater risk of damage to the area. Move your fingers often to reduce stiffness and swelling. Activity Rest your elbow   and wrist and avoid activities that cause symptoms as told by your health care provider. Do physical therapy exercises as told by your health care provider. If you lift an object, lift it with your palm facing up. This reduces stress on your elbow. Lifestyle If your tennis elbow is caused by sports, check your equipment and make sure that: You use it correctly. It is good match for you. If your tennis elbow is caused by work or computer  use, take frequent breaks to stretch your arm. Talk with your employer about ways to manage your condition at work. General instructions Take over-the-counter and prescription medicines only as told by your health care provider. Do not use any products that contain nicotine or tobacco. These products include cigarettes, chewing tobacco, and vaping devices, such as e-cigarettes. If you need help quitting, ask your health care provider. Keep all follow-up visits. This is important. How is this prevented? Before and after activity: Warm up and stretch before being active. Cool down and stretch after being active. Give your body time to rest between periods of activity. During activity: Make sure to use equipment that fits you. If you play tennis, put power in your stroke with your lower body. Avoid using your arm only. Maintain physical fitness, including: Strength. Flexibility. Endurance. Do exercises to strengthen the forearm muscles. Contact a health care provider if: You have pain that gets worse or does not get better with treatment. You have numbness or weakness in your forearm, hand, or fingers. Get help right away if: Your pain is severe. You cannot move your wrist. Summary Tennis elbow (lateral epicondylitis) is inflammation of tendons in your outer forearm, near your elbow. Common symptoms include pain and tenderness in your forearm and the outer part of your elbow. This condition is usually caused by repeatedly extending your wrist, turning your forearm, and using your hands. The first treatment is often resting and icing your arm to relieve symptoms. Further treatment may include taking medicine, getting physical therapy, wearing a brace or strap, or having surgery. This information is not intended to replace advice given to you by your health care provider. Make sure you discuss any questions you have with your health care provider. Document Revised: 12/01/2019 Document  Reviewed: 12/01/2019 Elsevier Patient Education  2024 [employer].  

## 2023-03-04 NOTE — Assessment & Plan Note (Signed)
Discussed issues of excess calories form alcohol use. Encourage regular aerobic exercise.

## 2023-03-04 NOTE — Progress Notes (Signed)
[facility] [address] [place] [address] Dept: [phone] Dept Fax: [phone]  Transfer of Care Office Visit  Subjective:    Patient ID: Chad Alvarez, male    DOB: April 16, 1986, 37 y.o..   MRN: [id number]  Chief Complaint  Patient presents with   Establish Care    [facility]- establish care.      History of Present Illness:  Patient is in today to establish care. Mr. Morrical was born in [place], [place]. At a young age, his father was murdered, so his family moved back to [place] In his teen years, thy moved to [place]. He moved to [place] in 2010, after a divorce, to be closer to family. He has now been remarried for 10 years. He has three children (22, 11, 10). Mr. Polio works for [employer]. Mr. Banko smokes about a 1/3 of a ppd. He has contemplated quitting and is working to cut this down. He drinks about 3-4 beers per day. He denies drug use.  Mr. Whilden has been struggling with a right lateral epicondylitis. He finds that his work does exacerbate this. He had a steroid injection for this in early August. The pain did improve. He did not go through with PT as recommended, noted problems with getting time off of work. 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 Since then, his pain has increased.  Mr. Zingale admits that he has had some weight gain. He is trying to recommit to exercise. He and his wife are concerned about him having diabetes, as he has a family history of this.  Mr. Gause also notes some issues with erectile difficulties. He is concerned that he might have a low testosterone level.  Past Medical History: Patient Active Problem List   Diagnosis Date Noted   Class 2 obesity due to excess calories with body mass index (BMI) of 37.0 to 37.9 in adult 03/04/2023   Erectile dysfunction 03/04/2023   Right lateral epicondylitis  03/04/2023   History reviewed. No pertinent surgical history. Family History  Problem Relation Age of Onset   Diabetes Mother    Outpatient Medications Prior to Visit  Medication Sig Dispense Refill   naproxen (NAPROSYN) 500 MG tablet SMARTSIG:1 Tablet(s) By Mouth Every 12 Hours PRN     predniSONE (STERAPRED UNI-PAK 21 TAB) 10 MG (21) TBPK tablet Take as directed (Patient not taking: Reported on 07/22/2020) 21 tablet 0   sildenafil (VIAGRA) 100 MG tablet Take 1 tablet (100 mg total) by mouth daily as needed for erectile dysfunction. 10 tablet 2   No facility-administered medications prior to visit.   No Known Allergies    Objective:   Today's Vitals   03/04/23 1023  BP: 126/74  Pulse: 88  Temp: 98.4 F (36.9 C)  TempSrc: Temporal  SpO2: 98%  Weight: 256 lb (116.1 kg)  Height: 5\' 9"  (1.753 m)   Body mass index is 37.8 kg/m.   General: Well developed, well nourished. No acute distress. Extremities: There is pain on palpation over the lateral epicondyle. Psych: Alert and oriented. Normal mood and affect.  Health Maintenance Due  Topic Date Due   HIV Screening  Never done   Hepatitis C Screening  Never done     Assessment & Plan:   Problem List Items Addressed This Visit       Musculoskeletal and Integument   Right lateral epicondylitis  Recommend physical therapy, but Mr. Ingalls is worried how taking time for this could lead to ultimate termination. I recommend for now relative rest, applying ice each evenign, using his tennis elbow strap, and a 7-14 day course of naproxen.      Relevant Medications   naproxen (NAPROSYN) 500 MG tablet     Other   Class 2 obesity due to excess calories with body mass index (BMI) of 37.0 to 37.9 in adult - Primary    Discussed issues of excess calories form alcohol use. Encourage regular aerobic exercise.      Relevant Orders   Hemoglobin A1c   Basic metabolic panel   Erectile dysfunction    We discussed potential causes,  such as hypogonadism. I will plan to have him return for a morning testosterone level. We discussed that if this is low, we would want to repeat the test.      Relevant Orders   Testosterone    Return for Schedule physical exam.   Loyola Mast, MD

## 2023-03-04 NOTE — Assessment & Plan Note (Signed)
We discussed potential causes, such as hypogonadism. I will plan to have him return for a morning testosterone level. We discussed that if this is low, we would want to repeat the test.

## 2023-04-01 ENCOUNTER — Ambulatory Visit (INDEPENDENT_AMBULATORY_CARE_PROVIDER_SITE_OTHER): Payer: BC Managed Care – PPO | Admitting: Family Medicine

## 2023-04-01 ENCOUNTER — Encounter: Payer: Self-pay | Admitting: Family Medicine

## 2023-04-01 VITALS — BP 128/76 | HR 88 | Temp 98.3°F | Ht 69.0 in | Wt 261.0 lb

## 2023-04-01 DIAGNOSIS — B353 Tinea pedis: Secondary | ICD-10-CM | POA: Diagnosis not present

## 2023-04-01 DIAGNOSIS — Z6837 Body mass index (BMI) 37.0-37.9, adult: Secondary | ICD-10-CM

## 2023-04-01 DIAGNOSIS — Z13220 Encounter for screening for lipoid disorders: Secondary | ICD-10-CM

## 2023-04-01 DIAGNOSIS — E66812 Obesity, class 2: Secondary | ICD-10-CM | POA: Diagnosis not present

## 2023-04-01 DIAGNOSIS — Z0000 Encounter for general adult medical examination without abnormal findings: Secondary | ICD-10-CM

## 2023-04-01 DIAGNOSIS — E6609 Other obesity due to excess calories: Secondary | ICD-10-CM

## 2023-04-01 DIAGNOSIS — R7303 Prediabetes: Secondary | ICD-10-CM

## 2023-04-01 DIAGNOSIS — N529 Male erectile dysfunction, unspecified: Secondary | ICD-10-CM | POA: Diagnosis not present

## 2023-04-01 DIAGNOSIS — Z9189 Other specified personal risk factors, not elsewhere classified: Secondary | ICD-10-CM

## 2023-04-01 DIAGNOSIS — R7989 Other specified abnormal findings of blood chemistry: Secondary | ICD-10-CM | POA: Insufficient documentation

## 2023-04-01 LAB — LIPID PANEL
Cholesterol: 160 mg/dL (ref 0–200)
HDL: 61.6 mg/dL (ref >39.00)
LDL Cholesterol: 87 mg/dL (ref 0–99)
NonHDL: 97.97
Total CHOL/HDL Ratio: 3
Triglycerides: 57 mg/dL (ref 0.0–149.0)
VLDL: 11.4 mg/dL (ref 0.0–40.0)

## 2023-04-01 LAB — BASIC METABOLIC PANEL
BUN: 16 mg/dL (ref 6–23)
CO2: 28 meq/L (ref 19–32)
Calcium: 9.4 mg/dL (ref 8.4–10.5)
Chloride: 102 meq/L (ref 96–112)
Creatinine, Ser: 1.04 mg/dL (ref 0.40–1.50)
GFR: 90.55 mL/min (ref >60.00)
Glucose, Bld: 115 mg/dL — ABNORMAL HIGH (ref 70–99)
Potassium: 4.5 meq/L (ref 3.5–5.1)
Sodium: 140 meq/L (ref 135–145)

## 2023-04-01 LAB — HEMOGLOBIN A1C: Hgb A1c MFr Bld: 6 % (ref 4.6–6.5)

## 2023-04-01 LAB — TESTOSTERONE: Testosterone: 278.78 ng/dL — ABNORMAL LOW (ref 300.00–890.00)

## 2023-04-01 MED ORDER — CLOTRIMAZOLE-BETAMETHASONE 1-0.05 % EX CREA: 1.0000 | TOPICAL_CREAM | Freq: Every day | CUTANEOUS | 0 refills | Status: DC

## 2023-04-01 NOTE — Assessment & Plan Note (Signed)
Had recommend checking a testosterone level at his last visit, but this has not been run yet.

## 2023-04-01 NOTE — Progress Notes (Signed)
[facility] Trecia Rogers [facility] [address] [place] [address] Dept: [phone] Dept Fax: [phone]  Annual Physical Visit  Subjective:    Patient ID: Chad Alvarez, male    DOB: 1986-05-14, 37 y.o..   MRN: [id number]  Chief Complaint  Patient presents with   Annual Exam    CPE/labs.  No concerns.  Fasting today.    History of Present Illness:  Patient is in today for an annual physical/preventative visit.  Review of Systems  Constitutional:  Negative for chills, diaphoresis, fever, malaise/fatigue and weight loss.  HENT:  Negative for congestion, ear pain, hearing loss, sinus pain, sore throat and tinnitus.   Eyes:  Negative for blurred vision, pain, discharge and redness.  Respiratory:  Negative for cough, shortness of breath and wheezing.   Cardiovascular:  Negative for chest pain and palpitations.  Gastrointestinal:  Negative for abdominal pain, constipation, diarrhea, heartburn, nausea and vomiting.  Musculoskeletal:  Positive for joint pain. Negative for back pain and myalgias.       Has a history of right lateral epicondylitis. He had a steroid injection for this in August. He is currently using an NSAID. The pain is exacerbated by his work, but he now has some additional help. He does not feel he can take time off for PT.  Skin:  Positive for rash. Negative for itching.       Notes an issue with a rash on the soles of his feet. He believes this is a fungal rash. he has found that OTC medicines do not keep this under control. He gets new work boots periodically, as he notes these tend to get an odor that he can't abolish with sprays.  Psychiatric/Behavioral:  Negative for depression. The patient is not nervous/anxious.    Past Medical History: Patient Active Problem List   Diagnosis Date Noted   Class 2 obesity due to excess calories with body mass index (BMI) of 37.0 to 37.9 in adult 03/04/2023   Erectile dysfunction 03/04/2023    Right lateral epicondylitis 03/04/2023   History reviewed. No pertinent surgical history. Family History  Problem Relation Age of Onset   Stroke Mother    Heart disease Mother    Diabetes Mother    Other Father        Homicide   Diabetes Paternal Aunt    Outpatient Medications Prior to Visit  Medication Sig Dispense Refill   naproxen (NAPROSYN) 500 MG tablet Take 1 tablet (500 mg total) by mouth 2 (two) times daily with a meal. 28 tablet 0   No facility-administered medications prior to visit.   No Known Allergies Objective:   Today's Vitals   04/01/23 0848  BP: 128/76  Pulse: 88  Temp: 98.3 F (36.8 C)  TempSrc: Temporal  SpO2: 99%  Weight: 261 lb (118.4 kg)  Height: 5\' 9"  (1.753 m)   Body mass index is 38.54 kg/m.   Neck circumference: 51 cm  General: Well developed, well nourished. No acute distress. HEENT: Normocephalic, non-traumatic. PERRL, EOMI. Conjunctiva clear. External ears normal. EAC and TMs normal   bilaterally. Nose clear without congestion or rhinorrhea. Mucous membranes moist. Oropharynx clear. Good dentition. Neck: Supple. No lymphadenopathy. No thyromegaly. Lungs: Clear to auscultation bilaterally. No wheezing, rales or rhonchi. CV: RRR without murmurs or rubs. Pulses 2+ bilaterally. Abdomen: Soft, non-tender. Bowel sounds positive, normal pitch and frequency. No hepatosplenomegaly. No rebound   or guarding. Extremities: Full ROM. No joint swelling. Pain on palpation over the right lateral  epicondyle. No edema noted. Skin: Warm and dry. There is some scaliness across the soles and between the toes of the feet. Psych: Alert and oriented. Normal mood and affect.  Health Maintenance Due  Topic Date Due   HIV Screening  Never done   Hepatitis C Screening  Never done     STOP-Bang Score for Sleep Apnea Screening  Patient Self-Reported Questions         Score Do you snore loudly? (louder than  1  talking or sufficiently loud to be  heard through  doors)  Do you often feel tired, fatigued, or  0  sleepy during the daytime?  Has anyone observed you stop breathing 1  during sleep?  Do you have (or are you being treated for) 0  high blood pressure?  Clinical Information          Score BMI>35 kg/m2     1  Age > 50 years    0  Neck circumference > 40 cm   1  Gender (male)     1   Total      5  A score <3 indicates a low risk of sleep apnea.  Assessment & Plan:   Problem List Items Addressed This Visit       Other   Class 2 obesity due to excess calories with body mass index (BMI) of 37.0 to 37.9 in adult    Urge focus on weight loss, esp. in light of sleep apnea symptoms.      Relevant Orders   Basic metabolic panel   Hemoglobin A1c   Erectile dysfunction    Had recommend checking a testosterone level at his last visit, but this has not been run yet.      Relevant Orders   Testosterone   Other Visit Diagnoses     Annual physical exam    -  Primary   Overall fair health. Strongly urge weight loss through dietary adn exercise apporaches. Reviewed indicated screenings and immunizaitons.   Tinea pedis of both feet       I will prescribe Lotrisone.   Relevant Medications   clotrimazole-betamethasone (LOTRISONE) cream   Screening for lipid disorders       Relevant Orders   Lipid panel   At risk for sleep apnea       Relevant Orders   Ambulatory referral to Sleep Studies       Return in about 6 months (around 09/30/2023) for Reassessment.   Loyola Mast, MD

## 2023-04-01 NOTE — Assessment & Plan Note (Signed)
Urge focus on weight loss, esp. in light of sleep apnea symptoms.

## 2023-04-01 NOTE — Addendum Note (Signed)
Addended by: Loyola Mast on: 04/01/2023 03:58 PM   Modules accepted: Orders

## 2023-04-02 MED ORDER — TIRZEPATIDE-WEIGHT MANAGEMENT 2.5 MG/0.5ML ~~LOC~~ SOLN: 2.5000 mg | SUBCUTANEOUS | 0 refills | Status: DC

## 2023-04-02 MED ORDER — TIRZEPATIDE-WEIGHT MANAGEMENT 5 MG/0.5ML ~~LOC~~ SOLN: 5.0000 mg | SUBCUTANEOUS | 1 refills | Status: DC

## 2023-04-02 NOTE — Addendum Note (Signed)
Addended by: Loyola Mast on: 04/02/2023 09:36 AM   Modules accepted: Orders

## 2023-04-12 NOTE — Telephone Encounter (Signed)
PA has been approved from [id number] - 12/08/23.  Pharmacy notified VIA phone. Dm/cma

## 2023-04-12 NOTE — Telephone Encounter (Signed)
PA submitted through cover my meds.  Awaititng response.  Dm/cma   Key: Doreatha Martin

## 2023-04-25 ENCOUNTER — Other Ambulatory Visit: Payer: Self-pay | Admitting: Family Medicine

## 2023-04-25 DIAGNOSIS — E6609 Other obesity due to excess calories: Secondary | ICD-10-CM

## 2023-04-26 ENCOUNTER — Other Ambulatory Visit: Payer: Self-pay

## 2023-04-26 MED ORDER — ZEPBOUND 2.5 MG/0.5ML ~~LOC~~ SOAJ: 2.5000 mg | SUBCUTANEOUS | 1 refills | Status: DC

## 2023-04-26 MED ORDER — TIRZEPATIDE 2.5 MG/0.5ML ~~LOC~~ SOAJ: 2.5000 mg | SUBCUTANEOUS | 1 refills | Status: DC

## 2023-05-28 ENCOUNTER — Encounter: Payer: Self-pay | Admitting: Family Medicine

## 2023-05-30 ENCOUNTER — Other Ambulatory Visit: Payer: Self-pay

## 2023-05-30 ENCOUNTER — Other Ambulatory Visit: Payer: Self-pay | Admitting: Family

## 2023-05-30 DIAGNOSIS — E6609 Other obesity due to excess calories: Secondary | ICD-10-CM

## 2023-05-30 MED ORDER — ZEPBOUND 5 MG/0.5ML ~~LOC~~ SOAJ: 5.0000 mg | SUBCUTANEOUS | 1 refills | Status: DC

## 2023-05-30 MED ORDER — ZEPBOUND 5 MG/0.5ML ~~LOC~~ SOLN: 5.0000 mg | SUBCUTANEOUS | 1 refills | Status: DC

## 2023-05-30 NOTE — Telephone Encounter (Signed)
Can you please and thank you send in the Zepbound 5 mg in pen form for patient ? Thanks. Dm/cma

## 2023-06-03 ENCOUNTER — Other Ambulatory Visit (HOSPITAL_COMMUNITY): Payer: Self-pay

## 2023-06-04 ENCOUNTER — Other Ambulatory Visit (HOSPITAL_COMMUNITY): Payer: Self-pay

## 2023-06-18 ENCOUNTER — Other Ambulatory Visit (HOSPITAL_COMMUNITY): Payer: Self-pay

## 2023-06-26 ENCOUNTER — Telehealth: Payer: Self-pay | Admitting: Family Medicine

## 2023-06-26 NOTE — Telephone Encounter (Signed)
 ERROR

## 2023-06-28 ENCOUNTER — Telehealth: Payer: Self-pay | Admitting: Family Medicine

## 2023-06-28 NOTE — Telephone Encounter (Signed)
error

## 2023-07-02 ENCOUNTER — Telehealth: Payer: Self-pay

## 2023-07-02 ENCOUNTER — Other Ambulatory Visit (HOSPITAL_COMMUNITY): Payer: Self-pay

## 2023-07-02 NOTE — Telephone Encounter (Signed)
[facility]arm[facility] Patient Product/process development scientist completed.   The patient is insured through [employer]   Ran test claim for Zepbound 5 mg/0.5 ml auto injectors. Currently a quantity of 6 ml is a 84 day supply and the co-pay is $24.99 . The insurance company is requiring a 90 day supply and it is only written for a 30 day. Please send in new rx for a 90 day supply.  This test claim was processed through [facility]- copay amounts may vary at other pharmacies due to pharmacy/plan contracts, or as the patient moves through the different stages of their insurance plan.

## 2023-07-19 ENCOUNTER — Other Ambulatory Visit: Payer: Self-pay | Admitting: Family Medicine

## 2023-07-19 MED ORDER — ZEPBOUND 5 MG/0.5ML ~~LOC~~ SOAJ: 5.0000 mg | SUBCUTANEOUS | 1 refills | Status: DC

## 2023-07-19 NOTE — Telephone Encounter (Signed)
Copied from CRM [id number]. Topic: Clinical - Medication Refill >> Jul 19, 2023  2:27 PM Pascal Lux wrote: Most Recent Primary Care Visit:  Provider: Loyola Mast  Department: [facility]  Visit Type: PHYSICAL  Date: 04/01/2023  Medication: tirzepatide (ZEPBOUND) 5 MG/0.5ML Pen [[id number]]  Has the patient contacted their pharmacy? Yes (Agent: If no, request that the patient contact the pharmacy for the refill. If patient does not wish to contact the pharmacy document the reason why and proceed with request.) (Agent: If yes, when and what did the pharmacy advise?) Contact provider because insurance is not approving injectors but they will approve the pen.  Is this the correct pharmacy for this prescription? Yes If no, delete pharmacy and type the correct one.  This is the patient's preferred pharmacy:  [id number] [place], [place] - [address] [place] [address] Phone: [phone] Fax: [phone]   Has the prescription been filled recently? No  Is the patient out of the medication? Yes  Has the patient been seen for an appointment in the last year OR does the patient have an upcoming appointment? Yes  Can we respond through MyChart? Yes  Agent: Please be advised that Rx refills may take up to 3 business days. We ask that you follow-up with your pharmacy.

## 2023-07-21 ENCOUNTER — Other Ambulatory Visit: Payer: Self-pay | Admitting: Family Medicine

## 2023-07-24 ENCOUNTER — Other Ambulatory Visit (HOSPITAL_COMMUNITY): Payer: Self-pay

## 2023-07-31 ENCOUNTER — Other Ambulatory Visit: Payer: Self-pay | Admitting: Family

## 2023-07-31 DIAGNOSIS — E6609 Other obesity due to excess calories: Secondary | ICD-10-CM

## 2023-08-29 ENCOUNTER — Encounter: Payer: Self-pay | Admitting: Family Medicine

## 2023-09-18 ENCOUNTER — Other Ambulatory Visit: Payer: Self-pay | Admitting: Family Medicine

## 2023-10-14 ENCOUNTER — Ambulatory Visit: Payer: BC Managed Care – PPO | Admitting: Family Medicine

## 2023-10-14 ENCOUNTER — Encounter (HOSPITAL_COMMUNITY): Payer: Self-pay

## 2023-10-21 ENCOUNTER — Ambulatory Visit: Payer: BC Managed Care – PPO | Admitting: Family Medicine

## 2023-11-14 ENCOUNTER — Other Ambulatory Visit (HOSPITAL_COMMUNITY): Payer: Self-pay

## 2023-11-18 ENCOUNTER — Encounter: Payer: Self-pay | Admitting: Family Medicine

## 2023-11-18 ENCOUNTER — Ambulatory Visit: Admitting: Family Medicine

## 2023-11-18 VITALS — BP 124/72 | HR 97 | Temp 97.4°F | Ht 69.0 in | Wt 230.2 lb

## 2023-11-18 DIAGNOSIS — F411 Generalized anxiety disorder: Secondary | ICD-10-CM | POA: Insufficient documentation

## 2023-11-18 DIAGNOSIS — E66811 Obesity, class 1: Secondary | ICD-10-CM

## 2023-11-18 DIAGNOSIS — Z6833 Body mass index (BMI) 33.0-33.9, adult: Secondary | ICD-10-CM

## 2023-11-18 DIAGNOSIS — E6609 Other obesity due to excess calories: Secondary | ICD-10-CM

## 2023-11-18 DIAGNOSIS — F430 Acute stress reaction: Secondary | ICD-10-CM | POA: Insufficient documentation

## 2023-11-18 DIAGNOSIS — M7711 Lateral epicondylitis, right elbow: Secondary | ICD-10-CM

## 2023-11-18 MED ORDER — CITALOPRAM HYDROBROMIDE 10 MG PO TABS: 10.0000 mg | ORAL_TABLET | Freq: Every day | ORAL | 3 refills | Status: DC

## 2023-11-18 MED ORDER — NAPROXEN 500 MG PO TABS: 500.0000 mg | ORAL_TABLET | Freq: Two times a day (BID) | ORAL | 0 refills | Status: DC

## 2023-11-18 NOTE — Assessment & Plan Note (Signed)
 Continues to cause some discomfort. I will renew a course of naproxen .

## 2023-11-18 NOTE — Assessment & Plan Note (Signed)
 Mr. Havlin appears to be having acute anxiety due to multiple work and personal stressors. We did discuss the negative impact he would face were he to chose to commit a violent act. I recommended he look at potential options to change jobs. I offered referral for counseling, which he declined at the current time. I will start him on citalopram 10 mg daily and plan to see him back in 6 weeks. He asked about a leave of absence from work and [employer]. I would be supportive of this. I recommend he speak with his HR and send forms to me.

## 2023-11-18 NOTE — Assessment & Plan Note (Signed)
 Maximum weight: 270 lbs (07/2020) Current weight: 230 lbs Weight change since last visit: - 31 lbs Total weight loss: - 40 lbs (14.8%)  Congratulated on current weight loss. I recommend we continue his current 5 mg weekly dose of tirzepatide  (Zepbound ).
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
## 2023-11-19 ENCOUNTER — Telehealth: Payer: Self-pay

## 2023-11-19 ENCOUNTER — Telehealth: Payer: Self-pay | Admitting: Family Medicine

## 2023-11-19 NOTE — Telephone Encounter (Signed)
 Form placed on providers desk to fill out. Dm/cma

## 2023-11-19 NOTE — Telephone Encounter (Signed)
 Form on providers desk to fill out.  Dm/cma

## 2023-11-19 NOTE — Telephone Encounter (Signed)
 Copied from CRM [id number]. Topic: General - Other >> Nov 19, 2023  9:43 AM Howard Macho wrote: Reason for CRM: Rog from [employer] called stating he need medical information for a short term disability claim for the patient. Rog stated he need diagnosis, office notes and disability start date and return date [id number] [phone]  Fax-[phone] Claim: [id number]

## 2023-11-19 NOTE — Telephone Encounter (Signed)
 Patient dropped off document FMLA, to be filled out by provider. Patient requested to send it back via Call Patient to pick up within 7-days. Document is located in providers tray at front office.Please advise at [phone].

## 2023-11-20 NOTE — Telephone Encounter (Signed)
 Form was faxed to [employer] attn: Truett Gab @1 [phone].   Patient given a copy.  Dm/cma

## 2023-11-20 NOTE — Telephone Encounter (Signed)
 Copied from CRM [id number]. Topic: General - Other >> Nov 20, 2023 11:58 AM Martinique E wrote: Reason for CRM: Patient was returning a call from [place] in regards to [facility] paperwork. Patient stated he will be free after lunch to talk. Callback number [phone].

## 2023-11-20 NOTE — Telephone Encounter (Signed)
 Left VM to rtn call regarding FMLA papers being done.  Have a few questions/unfilled out papers.  Dm/cma

## 2023-11-20 NOTE — Telephone Encounter (Signed)
 Form faxed to [facility] @ [phone] along with last OV notes. Dm/cma

## 2023-11-20 NOTE — Telephone Encounter (Signed)
 Spoke to patient and he will come by and fill out his portion of the form and then I will be able to fax to his employer.  Her will com by after lunch. Dm/cma

## 2023-12-13 ENCOUNTER — Other Ambulatory Visit: Payer: Self-pay | Admitting: Family Medicine

## 2023-12-13 DIAGNOSIS — F411 Generalized anxiety disorder: Secondary | ICD-10-CM

## 2023-12-18 ENCOUNTER — Other Ambulatory Visit (HOSPITAL_COMMUNITY): Payer: Self-pay

## 2023-12-18 ENCOUNTER — Telehealth: Payer: Self-pay

## 2023-12-18 NOTE — Telephone Encounter (Signed)
 Pharmacy Patient Advocate Encounter  Received notification from [employer] that Prior Authorization for Zepbound  5 has been APPROVED from 12/18/23 to 12/17/24. Ran test claim, Copay is $24.99. This test claim was processed through [facility]- copay amounts may vary at other pharmacies due to pharmacy/plan contracts, or as the patient moves through the different stages of their insurance plan.   PA #/Case ID/Reference #: [id number]

## 2023-12-18 NOTE — Telephone Encounter (Signed)
 Pharmacy Patient Advocate Encounter   Received notification from CoverMyMeds that prior authorization for Zepbound  5 is required/requested.   Insurance verification completed.   The patient is insured through [employer] .   Per test claim: PA required; PA submitted to above mentioned insurance via CoverMyMeds Key/confirmation #/[id number] Status is pending

## 2023-12-19 ENCOUNTER — Ambulatory Visit: Admitting: Family Medicine

## 2023-12-19 ENCOUNTER — Encounter: Payer: Self-pay | Admitting: Family Medicine

## 2023-12-19 ENCOUNTER — Telehealth: Payer: Self-pay | Admitting: Family Medicine

## 2023-12-19 VITALS — BP 120/70 | HR 93 | Temp 97.6°F | Ht 69.0 in | Wt 235.6 lb

## 2023-12-19 DIAGNOSIS — F411 Generalized anxiety disorder: Secondary | ICD-10-CM

## 2023-12-19 DIAGNOSIS — F430 Acute stress reaction: Secondary | ICD-10-CM

## 2023-12-19 DIAGNOSIS — Z0279 Encounter for issue of other medical certificate: Secondary | ICD-10-CM

## 2023-12-19 MED ORDER — TRAZODONE HCL 50 MG PO TABS: 25.0000 mg | ORAL_TABLET | Freq: Every evening | ORAL | 3 refills | Status: AC | PRN

## 2023-12-19 MED ORDER — CITALOPRAM HYDROBROMIDE 20 MG PO TABS: 20.0000 mg | ORAL_TABLET | Freq: Every day | ORAL | 1 refills | Status: AC

## 2023-12-19 NOTE — Telephone Encounter (Signed)
 Form placed on providers desk to be filled out.  Dm/cma

## 2023-12-19 NOTE — Telephone Encounter (Signed)
 Patient dropped off document FMLA, to be filled out by provider. Patient requested to send it back via Call Patient to pick up within 5-days. Document is located in providers tray at front office.Please advise at Mobile [phone] (mobile)

## 2023-12-19 NOTE — Assessment & Plan Note (Signed)
 Chad Alvarez continues be having acute anxiety due to multiple work and personal stressors.  He is willing to have a referral for counseling today. I will increase his citalopram   to 20 mg daily and plan to see him back in 6 weeks. I will alo provide trazodone  25-50 mg at bedtime for sleep. He recommend we extend his leave of absence from work for another 6 weeks and he will provide me with FMLA forms for this.
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
## 2023-12-19 NOTE — Progress Notes (Deleted)
 [facility] [address] [place] [address] Dept: [phone] Dept Fax: [phone]  Chronic Care Office Visit  Subjective:    Patient ID: Chad Alvarez, male    DOB: [birth year], 38 y.o..   MRN: [id number]  Chief Complaint  Patient presents with   Anxiety    6 weeks anxiety.   Having issues with sleeping/anxiety.     History of Present Illness:  Patient is in today for reassessment of chronic medical issues.  Past Medical History: Patient Active Problem List   Diagnosis Date Noted   Anxiety in acute stress reaction 11/18/2023   Prediabetes 04/01/2023   Low testosterone  04/01/2023   Class 1 obesity due to excess calories with body mass index (BMI) of 33.0 to 33.9 in adult 03/04/2023   Erectile dysfunction 03/04/2023   Right lateral epicondylitis 03/04/2023   History reviewed. No pertinent surgical history. Family History  Problem Relation Age of Onset   Stroke Mother    Heart disease Mother    Diabetes Mother    Other Father        Homicide   Diabetes Paternal Aunt    Outpatient Medications Prior to Visit  Medication Sig Dispense Refill   naproxen  (NAPROSYN ) 500 MG tablet Take 1 tablet (500 mg total) by mouth 2 (two) times daily with a meal. 28 tablet 0   omeprazole (PRILOSEC OTC) 20 MG tablet Take 20 mg by mouth daily.     tirzepatide  (ZEPBOUND ) 5 MG/0.5ML Pen INJECT 5 MG SUBCUTANEOUSLY WEEKLY *NEXT FILL 3/11 6 mL 1   citalopram  (CELEXA ) 10 MG tablet TAKE 1 TABLET BY MOUTH EVERY DAY 90 tablet 0   No facility-administered medications prior to visit.   No Known Allergies Objective:   Today's Vitals   12/19/23 0857  BP: 120/70  Pulse: 93  Temp: 97.6 F (36.4 C)  TempSrc: Temporal  SpO2: 99%  Weight: 235 lb 9.6 oz (106.9 kg)  Height: 5' 9 (1.753 m)   Body mass index is 34.79 kg/m.   General: Well developed, well nourished. No acute distress. HEENT: Normocephalic, non-traumatic. External ears  normal. EAC and TMs normal bilaterally. PERRL, EOMI. Conjunctiva clear. Nose   clear without congestion or rhinorrhea. Mucous membranes moist. Oropharynx clear. Good dentition. Neck: Supple. No lymphadenopathy. No thyromegaly. Lungs: Clear to auscultation bilaterally. No wheezing, rales or rhonchi. CV: RRR without murmurs or rubs. Pulses 2+ bilaterally. Abdomen: Soft, non-tender. Bowel sounds positive, normal pitch and frequency. No hepatosplenomegaly. No rebound or guarding. Back: Straight. No CVA tenderness bilaterally. Extremities: Full ROM. No joint swelling or tenderness. No edema noted. Skin: Warm and dry. No rashes. Neuro: CN II-XII intact. Normal sensation and DTR bilaterally. Psych: Alert and oriented. Normal mood and affect.  Health Maintenance Due  Topic Date Due   HIV Screening  Never done   Hepatitis C Screening  Never done   Pneumococcal Vaccine 26-19 Years old (1 of 2 - PCV) Never done   Hepatitis B Vaccines (1 of 3 - 19+ 3-dose series) Never done   HPV VACCINES (1 - 3-dose SCDM series) Never done    Lab Results {Labs (Optional):29002}    Assessment & Plan:   Problem List Items Addressed This Visit       Other   Anxiety in acute stress reaction - Primary   Relevant Medications   citalopram  (CELEXA ) 20 MG tablet   traZODone  (DESYREL ) 50 MG tablet   Other Relevant Orders   Ambulatory referral to Psychology  Return in about 6 weeks (around 01/30/2024) for Reassessment.   [name], MD

## 2023-12-23 NOTE — Telephone Encounter (Addendum)
 Left message for patient that paperwork completed and faxed. A copy place in front office folder for patient to pick and copy to be scanned into patient's chart.

## 2023-12-26 NOTE — Telephone Encounter (Signed)
 Copied from CRM [id number]. Topic: Medical Record Request - Records Request >> Dec 26, 2023  2:36 PM [name] wrote: Reason for CRM: Patient is requesting his Healthcare Certification Form to be sent to a corporation he's with:  [name] [phone]

## 2023-12-30 ENCOUNTER — Ambulatory Visit: Admitting: Family Medicine

## 2023-12-30 NOTE — Telephone Encounter (Signed)
 Lft VM for a return call. Dm/cma

## 2023-12-30 NOTE — Telephone Encounter (Signed)
 Patient notified VIA that form was re-faxed to number provided due to they stated they didn't get it.  Dm/cma

## 2023-12-31 ENCOUNTER — Telehealth: Payer: Self-pay | Admitting: Family Medicine

## 2023-12-31 NOTE — Telephone Encounter (Signed)
Left VM to rtn call. Dm/cma       

## 2023-12-31 NOTE — Telephone Encounter (Signed)
 Copied from CRM [id number]. Topic: Medical Record Request - Other >> Dec 30, 2023  4:56 PM Jayma L wrote: Reason for CRM: there was a fax sent to valerie at [facility] supply and there was no return date listed for patient to go back to work , please reach valerie at [phone]

## 2023-12-31 NOTE — Telephone Encounter (Signed)
 Copied from CRM [id number]. Topic: Medical Record Request - Other >> Dec 30, 2023  4:56 PM Jayma L wrote: Reason for CRM: there was a fax sent to valerie at [facility] supply and there was no return date listed for patient to go back to work , please reach valerie at [phone] >> Dec 31, 2023 12:32 PM Drema MATSU wrote: Berwyn from [facility] [employer] is returning Denise's call.

## 2023-12-31 NOTE — Telephone Encounter (Signed)
 Left VM x2 times ro rtn call. Dm/cma

## 2024-01-01 NOTE — Telephone Encounter (Signed)
 Left a VM again for a return call yesterday about 4:30 pm. Dm/cma

## 2024-01-01 NOTE — Telephone Encounter (Signed)
 Spoke to Chad Alvarez she just needed a clarification of the return date from the form. Dm/cma

## 2024-01-01 NOTE — Telephone Encounter (Signed)
 Lft VM to rtn call. Dm/cma

## 2024-02-05 ENCOUNTER — Telehealth (INDEPENDENT_AMBULATORY_CARE_PROVIDER_SITE_OTHER): Payer: Self-pay | Admitting: Family Medicine

## 2024-02-05 ENCOUNTER — Encounter: Payer: Self-pay | Admitting: Family Medicine

## 2024-02-05 VITALS — Ht 69.0 in | Wt 228.0 lb

## 2024-02-05 DIAGNOSIS — F4321 Adjustment disorder with depressed mood: Secondary | ICD-10-CM

## 2024-02-05 DIAGNOSIS — F418 Other specified anxiety disorders: Secondary | ICD-10-CM

## 2024-02-05 DIAGNOSIS — E66812 Obesity, class 2: Secondary | ICD-10-CM

## 2024-02-05 MED ORDER — ZEPBOUND 5 MG/0.5ML ~~LOC~~ SOAJ: 5.0000 mg | SUBCUTANEOUS | 1 refills | Status: DC
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
## 2024-02-05 NOTE — Assessment & Plan Note (Signed)
 Discussed focus on adequate sleep, eating regular meals, walking each day, and looking to others for support. Continue citalopram  20 mg daily and trazodone  25-50 mg daily as needed for sleep. When he returns to the [place], he should reconnect with his counselor and plan to follow up with me.

## 2024-02-05 NOTE — Assessment & Plan Note (Signed)
 Continue weight loss by history. I will renew his tirzepatide .

## 2024-02-06 ENCOUNTER — Telehealth: Payer: Self-pay | Admitting: Family Medicine

## 2024-02-06 NOTE — Telephone Encounter (Signed)
 Form on providers desk to fill out and sign. Dm/cma

## 2024-02-06 NOTE — Telephone Encounter (Signed)
 Patient dropped off document FMLA, to be filled out by provider. Patient requested to send it back via Call Patient to pick up within 7-days. Document is located in providers tray at front office.Please advise at Mobile [phone] (mobile)  Pt wife dropped off paperwork @ 1:50 today and I put in the dr box

## 2024-02-07 ENCOUNTER — Telehealth: Payer: Self-pay

## 2024-02-07 DIAGNOSIS — Z0279 Encounter for issue of other medical certificate: Secondary | ICD-10-CM

## 2024-02-07 NOTE — Telephone Encounter (Signed)
 Copied from CRM [id number]. Topic: General - Other >> Feb 06, 2024 11:14 AM Viola F wrote: Reason for CRM: Patient seen Dr. Thedora yesterday via virtual visit, wants to know the status of short term disability paperwork for his job, his job says his paperwork needs to be submitted by Friday 02/07/24 or he will be terminated. Please call patient today with an update at [phone] - patient says Karna was working on this

## 2024-02-07 NOTE — Telephone Encounter (Signed)
 Form was dropped off yesterday and is on providers desk to fill out.  Will call when its done. Dm/cma

## 2024-02-10 NOTE — Telephone Encounter (Signed)
 Form faxed and given to Kristie for billing. Left VM form patient to rtn call to see if he wants to come pick up copy or have it mailed to home address. Dm/cma

## 2024-02-11 NOTE — Telephone Encounter (Signed)
 Copied from CRM [id number]. Topic: General - Call Back - No Documentation >> Feb 10, 2024  4:59 PM Rea ORN wrote: Reason for CRM: Pt returned Osmond call. After speaking with CAL, pt was advised that he can pick up paperwork. Pt asked for the paperwork to be mailed to address in chart.

## 2024-02-11 NOTE — Telephone Encounter (Signed)
 FMLA paperwork was placed in mail basket at front desk for (outgoing) as requested by patient.  He was also called to inform; left VM; copy was also placed in scan folder on 02/11/2024.

## 2024-02-12 NOTE — Telephone Encounter (Signed)
 [employer] disablislity form filled out and faxed back to them at [phone].  Copy of this mailed to patients home address. Dm/cma

## 2024-02-18 ENCOUNTER — Telehealth: Payer: Self-pay | Admitting: Family Medicine

## 2024-02-18 NOTE — Telephone Encounter (Signed)
 Patient dropped off document medical update questionaire, to be filled out by provider. Patient requested to send it back via Call Patient to pick up within 7-days. Document is located in providers tray at front office.Please advise at Mobile [phone] (mobile)  Pt walked in with paperwork. I put in the dr box

## 2024-02-18 NOTE — Telephone Encounter (Signed)
 Left detailed VM that that form was filled out and faxed to them on 02/12/24.  After the date on his letter.  Dm/cma

## 2024-02-18 NOTE — Telephone Encounter (Signed)
 Form on providers desk to be signed upon returning on 02/24/24. Dm/cma

## 2024-02-19 NOTE — Telephone Encounter (Signed)
 left VM to rtn call. Dm/cma

## 2024-02-20 NOTE — Telephone Encounter (Signed)
 eft VM to rtn call. Dm/cma

## 2024-02-27 NOTE — Telephone Encounter (Signed)
 Spoke to patient and he will come by and pick up the completed form due to it was returned in the mail (unable to deliver) Dm/cma

## 2024-03-13 ENCOUNTER — Other Ambulatory Visit: Payer: Self-pay | Admitting: Family Medicine

## 2024-03-13 DIAGNOSIS — F411 Generalized anxiety disorder: Secondary | ICD-10-CM

## 2024-04-20 ENCOUNTER — Telehealth: Payer: Self-pay

## 2024-04-20 NOTE — Telephone Encounter (Signed)
 Left detailed VM to rtn call. Dm/cma

## 2024-04-20 NOTE — Telephone Encounter (Signed)
 Copied from CRM #[id number]. Topic: Clinical - Request for Lab/Test Order >> Apr 20, 2024 11:33 AM Macario HERO wrote: Reason for CRM: Patient spouse called requesting labs prior to physical 06/08/24.

## 2024-06-08 ENCOUNTER — Encounter: Payer: Self-pay | Admitting: Family Medicine

## 2024-06-08 ENCOUNTER — Ambulatory Visit: Payer: Self-pay | Admitting: Family Medicine

## 2024-06-08 VITALS — BP 122/74 | HR 83 | Temp 98.4°F | Ht 69.0 in | Wt 224.6 lb

## 2024-06-08 DIAGNOSIS — M7711 Lateral epicondylitis, right elbow: Secondary | ICD-10-CM

## 2024-06-08 DIAGNOSIS — F418 Other specified anxiety disorders: Secondary | ICD-10-CM

## 2024-06-08 DIAGNOSIS — E66812 Obesity, class 2: Secondary | ICD-10-CM | POA: Diagnosis not present

## 2024-06-08 DIAGNOSIS — Z0000 Encounter for general adult medical examination without abnormal findings: Secondary | ICD-10-CM | POA: Insufficient documentation

## 2024-06-08 DIAGNOSIS — Z1159 Encounter for screening for other viral diseases: Secondary | ICD-10-CM

## 2024-06-08 DIAGNOSIS — R7303 Prediabetes: Secondary | ICD-10-CM | POA: Diagnosis not present

## 2024-06-08 LAB — BASIC METABOLIC PANEL WITH GFR
BUN: 15 mg/dL (ref 6–23)
CO2: 28 meq/L (ref 19–32)
Calcium: 9.2 mg/dL (ref 8.4–10.5)
Chloride: 104 meq/L (ref 96–112)
Creatinine, Ser: 1 mg/dL (ref 0.40–1.50)
GFR: 94.13 mL/min
Glucose, Bld: 111 mg/dL — ABNORMAL HIGH (ref 70–99)
Potassium: 4.1 meq/L (ref 3.5–5.1)
Sodium: 140 meq/L (ref 135–145)

## 2024-06-08 LAB — HEMOGLOBIN A1C: Hgb A1c MFr Bld: 5.6 % (ref 4.6–6.5)

## 2024-06-08 MED ORDER — NAPROXEN 500 MG PO TABS: 500.0000 mg | ORAL_TABLET | Freq: Two times a day (BID) | ORAL | 0 refills | Status: AC

## 2024-06-08 MED ORDER — ZEPBOUND 5 MG/0.5ML ~~LOC~~ SOAJ: 5.0000 mg | SUBCUTANEOUS | 1 refills | Status: AC

## 2024-06-08 NOTE — Assessment & Plan Note (Signed)
 Improved. I will renew Flexeril for episodic use.

## 2024-06-08 NOTE — Assessment & Plan Note (Signed)
 Overall health is very good. Recommend ongoing regular exercise. Discussed recommended screenings and immunizations.

## 2024-06-08 NOTE — Progress Notes (Signed)
 " [facility] [address] [place] [address] Dept: [phone] Dept Fax: [phone]  Annual Physical Visit  Subjective:    Patient ID: Chad Alvarez, male    DOB: 1985/10/17, 39 y.o..   MRN: [id number]  Chief Complaint  Patient presents with   Annual Exam    CPE/labs.     History of Present Illness:  Patient is in today for an annual physical/preventative visit.  Since October, [name] has moved back to [place], [place]. 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  [name] has a history of Class 2 obesity. He is managed on tirzepatide  (Zepbound ) 5 mg weekly. He ntoes he ahd some recent weight regain after a cruise. He is engaging in regular exercise.  Review of Systems  Constitutional:  Negative for chills, diaphoresis, fever, malaise/fatigue and weight loss.  HENT:  Negative for congestion, ear pain, hearing loss, sinus pain, sore throat and tinnitus.   Eyes:  Positive for blurred vision. Negative for pain, discharge and redness.       Now has glasses and feels his vision is much improved with this.  Respiratory:  Negative for cough, shortness of breath and wheezing.   Cardiovascular:  Negative for chest pain and palpitations.  Gastrointestinal:  Positive for heartburn. Negative for abdominal pain, constipation, diarrhea, nausea and vomiting.       Notes heartburn with certain foods, esp. acidic things like tomato sauces. Avoidance of these works well.  Musculoskeletal:  Negative for back pain, joint pain and myalgias.  Skin:  Negative for itching and rash.  Psychiatric/Behavioral:  Negative for depression. The patient is nervous/anxious.        Last summer/fall, Mr. Mendizabal was experiencing marked anxiety due to situational stressors. He feels these are doing better overall. He feels he has his life more in balance, despite some ongoing anxiety at times.   Past Medical History: Patient Active Problem List   Diagnosis  Date Noted   Anxiety with depression 02/05/2024   Anxiety in acute stress reaction 11/18/2023   Prediabetes 04/01/2023   Low testosterone  04/01/2023   Class 2 obesity- Initial BMI- 39.8 03/04/2023   Erectile dysfunction 03/04/2023   Right lateral epicondylitis 03/04/2023   No past surgical history on file. Family History  Problem Relation Age of Onset   Stroke Mother    Heart disease Mother    Diabetes Mother    Other Father        Homicide   Diabetes Paternal Aunt    Outpatient Medications Prior to Visit  Medication Sig Dispense Refill   citalopram  (CELEXA ) 20 MG tablet Take 1 tablet (20 mg total) by mouth daily. 30 tablet 1   naproxen  (NAPROSYN ) 500 MG tablet Take 1 tablet (500 mg total) by mouth 2 (two) times daily with a meal. 28 tablet 0   omeprazole (PRILOSEC OTC) 20 MG tablet Take 20 mg by mouth daily.     tirzepatide  (ZEPBOUND ) 5 MG/0.5ML Pen Inject 5 mg into the skin once a week. 6 mL 1   traZODone  (DESYREL ) 50 MG tablet Take 0.5-1 tablets (25-50 mg total) by mouth at bedtime as needed for sleep. 30 tablet 3   No facility-administered medications prior to visit.   Allergies[1] Objective:   Today's Vitals   06/08/24 1317  BP: 122/74  Pulse: 83  Temp: 98.4 F (36.9 C)  TempSrc: Temporal  SpO2: 97%  Weight: 224 lb 9.6 oz (101.9 kg)  Height: 5' 9 (1.753 m)  Body mass index is 33.17 kg/m.   General: Well developed, well nourished. No acute distress. HEENT: Normocephalic, non-traumatic. PERRL, EOMI. Conjunctiva clear. External ears normal. EAC and TMs normal   bilaterally. Nose clear without congestion or rhinorrhea. Mucous membranes moist. Oropharynx clear. Good dentition. Neck: Supple. No lymphadenopathy. No thyromegaly. Lungs: Clear to auscultation bilaterally. No wheezing, rales or rhonchi. CV: RRR without murmurs or rubs. Pulses 2+ bilaterally. Abdomen: Soft, non-tender. Bowel sounds positive, normal pitch and frequency. No hepatosplenomegaly. No rebound  or   guarding. Extremities: Full ROM. No joint swelling or tenderness. No edema noted. Skin: Warm and dry. No rashes. Psych: Alert and oriented. Normal mood and affect.  Health Maintenance Due  Topic Date Due   HIV Screening  Never done   Hepatitis C Screening  Never done   Pneumococcal Vaccine (1 of 2 - PCV) Never done   Hepatitis B Vaccines 19-59 Average Risk (1 of 3 - 19+ 3-dose series) Never done   HPV VACCINES (1 - 3-dose SCDM series) Never done     Assessment & Plan:   Problem List Items Addressed This Visit       Musculoskeletal and Integument   Right lateral epicondylitis   Improved. I will renew Flexeril for episodic use.      Relevant Medications   naproxen  (NAPROSYN ) 500 MG tablet     Other   Annual physical exam - Primary   Overall health is very good. Recommend ongoing regular exercise. Discussed recommended screenings and immunizations.       Anxiety with depression   Improved. Continue citalopram  20 mg daily and trazodone  25-50 mg daily as needed for sleep.      Class 2 obesity- Initial BMI- 39.8   Maximum weight: 270 lbs (07/2020) Current weight: 224 lbs Weight change since last visit: - 6 lbs Total weight loss: - 46 lbs (17 %)  Continue engaging in diet and exercise approaches. Continue tirzepatide  (Zepbound ) 5 mg weekly.      Relevant Medications   tirzepatide  (ZEPBOUND ) 5 MG/0.5ML Pen   Prediabetes   I will reassess his blood sugar and A1c today.      Relevant Orders   Hemoglobin A1c   Basic metabolic panel with GFR   Other Visit Diagnoses       Encounter for hepatitis C screening test for low risk patient       Relevant Orders   HCV Ab w Reflex to Quant PCR       Return in about 6 months (around 12/06/2024) for Reassessment.   [name], MD  I,Emily Lagle,acting as a scribe for [name], MD.,have documented all relevant documentation on the behalf of [name], MD.  I, [name], MD, have reviewed all  documentation for this visit. The documentation on 06/08/2024 for the exam, diagnosis, procedures, and orders are all accurate and complete.     [1] No Known Allergies  "

## 2024-06-08 NOTE — Assessment & Plan Note (Signed)
 Maximum weight: 270 lbs (07/2020) Current weight: 224 lbs Weight change since last visit: - 6 lbs Total weight loss: - 46 lbs (17 %)  Continue engaging in diet and exercise approaches. Continue tirzepatide  (Zepbound ) 5 mg weekly.

## 2024-06-08 NOTE — Assessment & Plan Note (Signed)
 I will reassess his blood sugar and A1c today.

## 2024-06-08 NOTE — Assessment & Plan Note (Signed)
 Improved. Continue citalopram  20 mg daily and trazodone  25-50 mg daily as needed for sleep.

## 2024-06-09 ENCOUNTER — Ambulatory Visit: Payer: Self-pay | Admitting: Family Medicine

## 2024-06-09 LAB — HCV INTERPRETATION

## 2024-06-09 LAB — HCV AB W REFLEX TO QUANT PCR: HCV Ab: NONREACTIVE
